# Patient Record
Sex: Female | Born: 1985 | Race: White | Hispanic: No | Marital: Single | Smoking: Former smoker
Health system: Southern US, Community
[De-identification: ages and names within clinical notes are randomized; demographics above are authoritative.]

## PROBLEM LIST (undated history)

## (undated) DIAGNOSIS — N83209 Unspecified ovarian cyst, unspecified side: Secondary | ICD-10-CM

## (undated) DIAGNOSIS — G4700 Insomnia, unspecified: Secondary | ICD-10-CM

## (undated) DIAGNOSIS — K5790 Diverticulosis of intestine, part unspecified, without perforation or abscess without bleeding: Secondary | ICD-10-CM

## (undated) DIAGNOSIS — Z789 Other specified health status: Secondary | ICD-10-CM

## (undated) DIAGNOSIS — F419 Anxiety disorder, unspecified: Secondary | ICD-10-CM

## (undated) HISTORY — DX: Unspecified ovarian cyst, unspecified side: N83.209

## (undated) HISTORY — DX: Other specified health status: Z78.9

## (undated) HISTORY — DX: Insomnia, unspecified: G47.00

## (undated) HISTORY — DX: Diverticulosis of intestine, part unspecified, without perforation or abscess without bleeding: K57.90

## (undated) HISTORY — DX: Anxiety disorder, unspecified: F41.9

---

## 2007-01-30 ENCOUNTER — Ambulatory Visit: Payer: Self-pay | Admitting: Family Medicine

## 2010-04-11 HISTORY — PX: INTRAUTERINE DEVICE (IUD) INSERTION: SHX5877

## 2011-12-13 ENCOUNTER — Ambulatory Visit: Payer: Self-pay | Admitting: Family Medicine

## 2014-03-11 ENCOUNTER — Ambulatory Visit: Payer: Self-pay

## 2015-03-09 ENCOUNTER — Encounter: Payer: Self-pay | Admitting: Family Medicine

## 2015-03-09 ENCOUNTER — Ambulatory Visit (INDEPENDENT_AMBULATORY_CARE_PROVIDER_SITE_OTHER): Payer: Self-pay | Admitting: Family Medicine

## 2015-03-09 VITALS — BP 120/60 | HR 72 | Ht 64.0 in | Wt 153.0 lb

## 2015-03-09 DIAGNOSIS — H109 Unspecified conjunctivitis: Secondary | ICD-10-CM

## 2015-03-09 DIAGNOSIS — J40 Bronchitis, not specified as acute or chronic: Secondary | ICD-10-CM

## 2015-03-09 MED ORDER — GUAIFENESIN-CODEINE 100-10 MG/5ML PO SOLN: 5.0000 mL | Freq: Three times a day (TID) | ORAL | Status: DC | PRN

## 2015-03-09 MED ORDER — SULFACETAMIDE SODIUM 10 % OP SOLN: 1.0000 [drp] | OPHTHALMIC | Status: DC

## 2015-03-09 MED ORDER — AZITHROMYCIN 250 MG PO TABS: ORAL_TABLET | ORAL | Status: DC

## 2015-03-09 NOTE — Progress Notes (Signed)
Name: Kelsey ClayJessica S Bryant   MRN: [id number]    DOB: Jul 25, 1985   Date:03/09/2015       Progress Note  Subjective  Chief Complaint  Chief Complaint  Patient presents with  . Conjunctivitis    started eye drop on Thursday- (Polymixin B) - eyes are still matted shut in am     Conjunctivitis  The current episode started 3 to 5 days ago. The problem occurs continuously. The problem has been gradually worsening. The problem is moderate. Nothing relieves the symptoms. Nothing aggravates the symptoms. Associated symptoms include eye itching, headaches, mouth sores, sore throat, cough, eye discharge and eye redness. Pertinent negatives include no fever, no decreased vision, no double vision, no photophobia, no abdominal pain, no constipation, no diarrhea, no nausea, no congestion, no ear discharge, no ear pain, no hearing loss, no rhinorrhea, no swollen glands, no neck pain, no wheezing, no rash and no eye pain. The eye pain is mild. Both eyes are affected.The eyelid exhibits redness.    No problem-specific assessment & plan notes found for this encounter.   No past medical history on file.  No past surgical history on file.  No family history on file.  Social History   Social History  . Marital Status: Single    Spouse Name: N/A  . Number of Children: N/A  . Years of Education: N/A   Occupational History  . Not on file.   Social History Main Topics  . Smoking status: Former Games developermoker  . Smokeless tobacco: Not on file  . Alcohol Use: 0.0 oz/week    0 Standard drinks or equivalent per week  . Drug Use: No  . Sexual Activity: Not on file   Other Topics Concern  . Not on file   Social History Narrative  . No narrative on file    Allergies  Allergen Reactions  . Ceftin [Cefuroxime]   . Penicillins      Review of Systems  Constitutional: Negative for fever, chills, weight loss and malaise/fatigue.  HENT: Positive for mouth sores and sore throat. Negative for congestion, ear  discharge, ear pain, hearing loss and rhinorrhea.   Eyes: Positive for discharge, redness and itching. Negative for blurred vision, double vision, photophobia and pain.  Respiratory: Positive for cough. Negative for sputum production, shortness of breath and wheezing.   Cardiovascular: Negative for chest pain, palpitations and leg swelling.  Gastrointestinal: Negative for heartburn, nausea, abdominal pain, diarrhea, constipation, blood in stool and melena.  Genitourinary: Negative for dysuria, urgency, frequency and hematuria.  Musculoskeletal: Negative for myalgias, back pain, joint pain and neck pain.  Skin: Negative for rash.  Neurological: Positive for headaches. Negative for dizziness, tingling, sensory change and focal weakness.  Endo/Heme/Allergies: Negative for environmental allergies and polydipsia. Does not bruise/bleed easily.  Psychiatric/Behavioral: Negative for depression and suicidal ideas. The patient is not nervous/anxious and does not have insomnia.      Objective  Filed Vitals:   03/09/15 1333  BP: 120/60  Pulse: 72  Height: 5\' 4"  (1.626 m)  Weight: 153 lb (69.4 kg)    Physical Exam  Constitutional: She is well-developed, well-nourished, and in no distress. No distress.  HENT:  Head: Normocephalic and atraumatic.  Right Ear: External ear normal.  Left Ear: External ear normal.  Nose: Nose normal.  Mouth/Throat: Oropharynx is clear and moist.  Eyes: Conjunctivae and EOM are normal. Pupils are equal, round, and reactive to light. Right eye exhibits no discharge. Left eye exhibits no discharge.  Neck: Normal  range of motion. Neck supple. No JVD present. No thyromegaly present.  Cardiovascular: Normal rate, regular rhythm, normal heart sounds and intact distal pulses.  Exam reveals no gallop and no friction rub.   No murmur heard. Pulmonary/Chest: Effort normal and breath sounds normal.  Abdominal: Soft. Bowel sounds are normal. She exhibits no mass. There is no  tenderness. There is no guarding.  Musculoskeletal: Normal range of motion. She exhibits no edema.  Lymphadenopathy:    She has no cervical adenopathy.  Neurological: She is alert. She has normal reflexes.  Skin: Skin is warm and dry. She is not diaphoretic.  Psychiatric: Mood and affect normal.  Nursing note and vitals reviewed.     Assessment & Plan  Problem List Items Addressed This Visit    None    Visit Diagnoses    Bilateral conjunctivitis    -  Primary    Relevant Medications    sulfacetamide (BLEPH-10) 10 % ophthalmic solution    Bronchitis        Relevant Medications    azithromycin (ZITHROMAX) 250 MG tablet    guaiFENesin-codeine 100-10 MG/5ML syrup         Dr. Hayden [facility]  03/09/2015

## 2015-03-09 NOTE — Patient Instructions (Signed)

## 2015-03-18 ENCOUNTER — Other Ambulatory Visit: Payer: Self-pay

## 2015-03-18 DIAGNOSIS — J019 Acute sinusitis, unspecified: Secondary | ICD-10-CM

## 2015-03-18 MED ORDER — DOXYCYCLINE HYCLATE 100 MG PO TABS: 100.0000 mg | ORAL_TABLET | Freq: Two times a day (BID) | ORAL | Status: DC

## 2015-11-26 ENCOUNTER — Ambulatory Visit (INDEPENDENT_AMBULATORY_CARE_PROVIDER_SITE_OTHER): Payer: Self-pay | Admitting: Family Medicine

## 2015-11-26 ENCOUNTER — Other Ambulatory Visit
Admission: RE | Admit: 2015-11-26 | Discharge: 2015-11-26 | Disposition: A | Discharge status: Home / Self Care | Payer: Self-pay | Source: Ambulatory Visit | Attending: Family Medicine | Admitting: Family Medicine

## 2015-11-26 ENCOUNTER — Encounter: Payer: Self-pay | Admitting: Family Medicine

## 2015-11-26 VITALS — BP 108/78 | HR 88 | Temp 98.2°F | Ht 64.0 in | Wt 168.0 lb

## 2015-11-26 DIAGNOSIS — R1030 Lower abdominal pain, unspecified: Secondary | ICD-10-CM | POA: Insufficient documentation

## 2015-11-26 DIAGNOSIS — K529 Noninfective gastroenteritis and colitis, unspecified: Secondary | ICD-10-CM

## 2015-11-26 LAB — CBC
HCT: 39 % (ref 35.0–47.0)
Hemoglobin: 13.4 g/dL (ref 12.0–16.0)
MCH: 31.6 pg (ref 26.0–34.0)
MCHC: 34.3 g/dL (ref 32.0–36.0)
MCV: 92.1 fL (ref 80.0–100.0)
PLATELETS: 208 10*3/uL (ref 150–440)
RBC: 4.23 MIL/uL (ref 3.80–5.20)
RDW: 12.5 % (ref 11.5–14.5)
WBC: 8.9 10*3/uL (ref 3.6–11.0)

## 2015-11-26 LAB — RENAL FUNCTION PANEL
ALBUMIN: 4.2 g/dL (ref 3.5–5.0)
Anion gap: 7 (ref 5–15)
BUN: 8 mg/dL (ref 6–20)
CHLORIDE: 106 mmol/L (ref 101–111)
CO2: 25 mmol/L (ref 22–32)
Calcium: 8.9 mg/dL (ref 8.9–10.3)
Creatinine, Ser: 0.66 mg/dL (ref 0.44–1.00)
GFR calc Af Amer: >60 mL/min (ref >60)
GFR calc non Af Amer: >60 mL/min (ref >60)
GLUCOSE: 92 mg/dL (ref 65–99)
POTASSIUM: 3.7 mmol/L (ref 3.5–5.1)
Phosphorus: 3.7 mg/dL (ref 2.5–4.6)
Sodium: 138 mmol/L (ref 135–145)

## 2015-11-26 LAB — LACTIC ACID, PLASMA: Lactic Acid, Venous: 0.7 mmol/L (ref 0.5–1.9)

## 2015-11-26 NOTE — Progress Notes (Signed)
Name: Kelsey ClayJessica S Bryant   MRN: [id number]    DOB: 1985/12/30   Date:11/26/2015       Progress Note  Subjective  Chief Complaint  Chief Complaint  Patient presents with  . Follow-up    ER visit for RLQ pain- CT showed no appendicitis/, did show enteritis. Abdominal pain is better today but still feels like "something is not right"- can't take oxycodone- makes her feel "dizzy/ horrible"    Abdominal Pain  This is a new problem. The current episode started in the past 7 days. The onset quality is sudden. The problem occurs constantly. The problem has been gradually improving. The pain is located in the RLQ. The pain is at a severity of 3/10. The pain is moderate. The quality of the pain is colicky. The abdominal pain radiates to the right flank and back. Associated symptoms include diarrhea, a fever and nausea. Pertinent negatives include no constipation, dysuria, frequency, headaches, hematochezia, hematuria, melena, myalgias or weight loss. The pain is aggravated by palpation. She has tried antibiotics and oral narcotic analgesics for the symptoms. The treatment provided moderate relief. Prior diagnostic workup includes CT scan. There is no history of Crohn's disease or ulcerative colitis.    No problem-specific Assessment & Plan notes found for this encounter.   History reviewed. No pertinent past medical history.  History reviewed. No pertinent surgical history.  History reviewed. No pertinent family history.  Social History   Social History  . Marital status: Single    Spouse name: N/A  . Number of children: N/A  . Years of education: N/A   Occupational History  . Not on file.   Social History Main Topics  . Smoking status: Former Games developermoker  . Smokeless tobacco: Never Used  . Alcohol use 0.0 oz/week  . Drug use: No  . Sexual activity: Yes   Other Topics Concern  . Not on file   Social History Narrative  . No narrative on file    Allergies  Allergen Reactions  .  Ceftin [Cefuroxime]   . Penicillins      Review of Systems  Constitutional: Positive for chills and fever. Negative for malaise/fatigue and weight loss.  HENT: Negative for ear discharge, ear pain and sore throat.   Eyes: Negative for blurred vision.  Respiratory: Negative for cough, sputum production, shortness of breath and wheezing.   Cardiovascular: Negative for chest pain, palpitations and leg swelling.  Gastrointestinal: Positive for abdominal pain, diarrhea and nausea. Negative for blood in stool, constipation, heartburn, hematochezia and melena.  Genitourinary: Negative for dysuria, frequency, hematuria and urgency.  Musculoskeletal: Negative for back pain, joint pain, myalgias and neck pain.  Skin: Negative for rash.  Neurological: Negative for dizziness, tingling, sensory change, focal weakness and headaches.  Endo/Heme/Allergies: Negative for environmental allergies and polydipsia. Does not bruise/bleed easily.  Psychiatric/Behavioral: Negative for depression and suicidal ideas. The patient is not nervous/anxious and does not have insomnia.      Objective  Vitals:   11/26/15 1339  BP: 108/78  Pulse: 88  Temp: 98.2 F (36.8 C)  TempSrc: Oral  Weight: 168 lb (76.2 kg)  Height: 5\' 4"  (1.626 m)    Physical Exam  Constitutional: She is well-developed, well-nourished, and in no distress. No distress.  HENT:  Head: Normocephalic and atraumatic.  Right Ear: External ear normal.  Left Ear: External ear normal.  Nose: Nose normal.  Mouth/Throat: Oropharynx is clear and moist.  Eyes: Conjunctivae and EOM are normal. Pupils are equal, round, and  reactive to light. Right eye exhibits no discharge. Left eye exhibits no discharge.  Neck: Normal range of motion. Neck supple. No JVD present. No thyromegaly present.  Cardiovascular: Normal rate, regular rhythm, normal heart sounds and intact distal pulses.  Exam reveals no gallop and no friction rub.   No murmur  heard. Pulmonary/Chest: Effort normal and breath sounds normal. She has no wheezes. She has no rales.  Abdominal: Soft. Bowel sounds are normal. She exhibits no mass. There is no hepatosplenomegaly, splenomegaly or hepatomegaly. There is tenderness in the right lower quadrant. There is no rigidity, no rebound, no guarding and no CVA tenderness.  Musculoskeletal: Normal range of motion. She exhibits no edema.  Lymphadenopathy:    She has no cervical adenopathy.  Neurological: She is alert. She has normal reflexes.  Skin: Skin is warm and dry. She is not diaphoretic.  Psychiatric: Mood and affect normal.  Nursing note and vitals reviewed.     Assessment & Plan  Problem List Items Addressed This Visit    None    Visit Diagnoses    Enteritis    -  Primary   contiue antibiotic/ recheck cbc/lactic acid        Dr. [facility]anna [facility]  11/26/15

## 2016-08-19 ENCOUNTER — Encounter: Payer: Self-pay | Admitting: Family Medicine

## 2016-08-19 ENCOUNTER — Ambulatory Visit (INDEPENDENT_AMBULATORY_CARE_PROVIDER_SITE_OTHER): Payer: Self-pay | Admitting: Family Medicine

## 2016-08-19 VITALS — BP 120/80 | HR 80 | Temp 98.7°F | Ht 64.0 in | Wt 177.0 lb

## 2016-08-19 DIAGNOSIS — L729 Follicular cyst of the skin and subcutaneous tissue, unspecified: Secondary | ICD-10-CM

## 2016-08-19 DIAGNOSIS — L089 Local infection of the skin and subcutaneous tissue, unspecified: Secondary | ICD-10-CM

## 2016-08-19 MED ORDER — DOXYCYCLINE HYCLATE 100 MG PO TABS: 100.0000 mg | ORAL_TABLET | Freq: Two times a day (BID) | ORAL | 0 refills | Status: DC

## 2016-08-19 NOTE — Progress Notes (Signed)
Name: Kelsey Bryant   MRN: [id number]    DOB: [birth year]   Date:08/19/2016       Progress Note  Subjective  Chief Complaint  Chief Complaint  Patient presents with  . Neck Pain    "have a place on my neck"- noticed on Tuesday- getting bigger. no oozing- but hurts very bad    Neck Pain   This is a new problem. The current episode started in the past 7 days. The problem occurs daily. The problem has been gradually worsening. Associated with: ?insect vs cyst. Pain location: left lateral neck. The pain is at a severity of 5/10. The pain is mild. Associated symptoms include headaches. Pertinent negatives include no chest pain, fever, leg pain, numbness, pain with swallowing, paresis, photophobia, syncope, tingling, trouble swallowing, visual change, weakness or weight loss. She has tried acetaminophen and ice (topical) for the symptoms.    No problem-specific Assessment & Plan notes found for this encounter.   No past medical history on file.  No past surgical history on file.  No family history on file.  Social History   Social History  . Marital status: Single    Spouse name: N/A  . Number of children: N/A  . Years of education: N/A   Occupational History  . Not on file.   Social History Main Topics  . Smoking status: Former Games developer  . Smokeless tobacco: Never Used  . Alcohol use 0.0 oz/week  . Drug use: No  . Sexual activity: Yes   Other Topics Concern  . Not on file   Social History Narrative  . No narrative on file    Allergies  Allergen Reactions  . Ceftin [Cefuroxime]   . Penicillins     Outpatient Medications Prior to Visit  Medication Sig Dispense Refill  . oxycodone (OXY-IR) 5 MG capsule Take 5 mg by mouth as needed.     No facility-administered medications prior to visit.     Review of Systems  Constitutional: Negative for chills, fever, malaise/fatigue and weight loss.  HENT: Negative for ear discharge, ear pain, sore throat and trouble  swallowing.   Eyes: Negative for blurred vision and photophobia.  Respiratory: Negative for cough, sputum production, shortness of breath and wheezing.   Cardiovascular: Negative for chest pain, palpitations, leg swelling and syncope.  Gastrointestinal: Negative for abdominal pain, blood in stool, constipation, diarrhea, heartburn, melena and nausea.  Genitourinary: Negative for dysuria, frequency, hematuria and urgency.  Musculoskeletal: Positive for neck pain. Negative for back pain, joint pain and myalgias.  Skin: Negative for rash.  Neurological: Positive for headaches. Negative for dizziness, tingling, sensory change, focal weakness, weakness and numbness.  Endo/Heme/Allergies: Negative for environmental allergies and polydipsia. Does not bruise/bleed easily.  Psychiatric/Behavioral: Negative for depression and suicidal ideas. The patient is not nervous/anxious and does not have insomnia.      Objective  Vitals:   08/19/16 1400  BP: 120/80  Pulse: 80  Temp: 98.7 F (37.1 C)  Weight: 177 lb (80.3 kg)  Height: 5\' 4"  (1.626 m)    Physical Exam  Constitutional: She is well-developed, well-nourished, and in no distress. No distress.  HENT:  Head: Normocephalic and atraumatic.  Right Ear: External ear normal.  Left Ear: External ear normal.  Nose: Nose normal.  Mouth/Throat: Oropharynx is clear and moist.  Eyes: Conjunctivae and EOM are normal. Pupils are equal, round, and reactive to light. Right eye exhibits no discharge. Left eye exhibits no discharge.  Neck: Normal range of motion.  Neck supple. No JVD present. No thyromegaly present.  Cardiovascular: Normal rate, regular rhythm, normal heart sounds and intact distal pulses.  Exam reveals no gallop and no friction rub.   No murmur heard. Pulmonary/Chest: Effort normal and breath sounds normal. She has no wheezes. She has no rales.  Abdominal: Soft. Bowel sounds are normal. She exhibits no mass. There is no tenderness. There  is no guarding.  Musculoskeletal: Normal range of motion. She exhibits no edema.  Lymphadenopathy:    She has no cervical adenopathy.  Neurological: She is alert. She has normal reflexes.  Skin: Skin is warm and dry. She is not diaphoretic. There is erythema.  tender  Psychiatric: Mood and affect normal.  Nursing note and vitals reviewed.     Assessment & Plan  Problem List Items Addressed This Visit    None    Visit Diagnoses    Infected cyst of skin    -  Primary   Relevant Medications   doxycycline (VIBRA-TABS) 100 MG tablet      Meds ordered this encounter  Medications  . doxycycline (VIBRA-TABS) 100 MG tablet    Sig: Take 1 tablet (100 mg total) by mouth 2 (two) times daily.    Dispense:  20 tablet    Refill:  0      Dr. Hayden Rasmusseneanna Graclyn Lawther [facility]  08/19/16

## 2017-09-26 ENCOUNTER — Encounter: Payer: Self-pay | Admitting: Family Medicine

## 2017-09-26 ENCOUNTER — Ambulatory Visit (INDEPENDENT_AMBULATORY_CARE_PROVIDER_SITE_OTHER): Payer: Self-pay | Admitting: Family Medicine

## 2017-09-26 VITALS — BP 110/80 | HR 100 | Ht 64.0 in | Wt 191.0 lb

## 2017-09-26 DIAGNOSIS — B009 Herpesviral infection, unspecified: Secondary | ICD-10-CM

## 2017-09-26 DIAGNOSIS — K122 Cellulitis and abscess of mouth: Secondary | ICD-10-CM

## 2017-09-26 MED ORDER — VALACYCLOVIR HCL 1 G PO TABS: 1000.0000 mg | ORAL_TABLET | Freq: Two times a day (BID) | ORAL | 0 refills | Status: DC

## 2017-09-26 MED ORDER — DOXYCYCLINE HYCLATE 100 MG PO TABS: 100.0000 mg | ORAL_TABLET | Freq: Two times a day (BID) | ORAL | 0 refills | Status: DC

## 2017-09-26 NOTE — Progress Notes (Signed)
Name: Kelsey Bryant   MRN: [id number]    DOB: November 22, 1985   Date:09/26/2017       Progress Note  Subjective  Chief Complaint  Chief Complaint  Patient presents with  . fever blister    on bottom lip from sun  . Neck Pain    lump under chin- tender. Noticed it this am    Onset of herpes 1 Sunday after sustain facial sunburn. Gradually worsened/increased swelling with tenderness. Patient developed swelling submental nodes.   No problem-specific Assessment & Plan notes found for this encounter.   History reviewed. No pertinent past medical history.  History reviewed. No pertinent surgical history.  History reviewed. No pertinent family history.  Social History   Socioeconomic History  . Marital status: Single    Spouse name: Not on file  . Number of children: Not on file  . Years of education: Not on file  . Highest education level: Not on file  Occupational History  . Not on file  Social Needs  . Financial resource strain: Not on file  . Food insecurity:    Worry: Not on file    Inability: Not on file  . Transportation needs:    Medical: Not on file    Non-medical: Not on file  Tobacco Use  . Smoking status: Former Games developer  . Smokeless tobacco: Never Used  Substance and Sexual Activity  . Alcohol use: Yes    Alcohol/week: 0.0 oz  . Drug use: No  . Sexual activity: Yes  Lifestyle  . Physical activity:    Days per week: Not on file    Minutes per session: Not on file  . Stress: Not on file  Relationships  . Social connections:    Talks on phone: Not on file    Gets together: Not on file    Attends religious service: Not on file    Active member of club or organization: Not on file    Attends meetings of clubs or organizations: Not on file    Relationship status: Not on file  . Intimate partner violence:    Fear of current or ex partner: Not on file    Emotionally abused: Not on file    Physically abused: Not on file    Forced sexual activity: Not on  file  Other Topics Concern  . Not on file  Social History Narrative  . Not on file    Allergies  Allergen Reactions  . Ceftin [Cefuroxime]   . Penicillins     Outpatient Medications Prior to Visit  Medication Sig Dispense Refill  . doxycycline (VIBRA-TABS) 100 MG tablet Take 1 tablet (100 mg total) by mouth 2 (two) times daily. 20 tablet 0   No facility-administered medications prior to visit.     Review of Systems  Constitutional: Negative for chills, fever, malaise/fatigue and weight loss.  HENT: Negative for ear discharge, ear pain and sore throat.   Eyes: Negative for blurred vision.  Respiratory: Negative for cough, sputum production, shortness of breath and wheezing.   Cardiovascular: Negative for chest pain, palpitations and leg swelling.  Gastrointestinal: Negative for abdominal pain, blood in stool, constipation, diarrhea, heartburn, melena and nausea.  Genitourinary: Negative for dysuria, frequency, hematuria and urgency.  Musculoskeletal: Negative for back pain, joint pain, myalgias and neck pain.  Skin: Negative for rash.  Neurological: Negative for dizziness, tingling, sensory change, focal weakness and headaches.  Endo/Heme/Allergies: Negative for environmental allergies and polydipsia. Does not bruise/bleed easily.  Psychiatric/Behavioral: Negative  for depression and suicidal ideas. The patient is not nervous/anxious and does not have insomnia.      Objective  Vitals:   09/26/17 1530  BP: 110/80  Pulse: 100  Weight: 191 lb (86.6 kg)  Height: 5\' 4"  (1.626 m)    Physical Exam  Constitutional: She is oriented to person, place, and time. She appears well-developed and well-nourished.  HENT:  Head: Normocephalic.  Right Ear: External ear normal.  Left Ear: External ear normal.  Mouth/Throat: Uvula is midline, oropharynx is clear and moist and mucous membranes are normal.  Swelling /tenderness right lower lip  Eyes: Pupils are equal, round, and reactive  to light. Conjunctivae and EOM are normal. Lids are everted and swept, no foreign bodies found. Left eye exhibits no hordeolum. No foreign body present in the left eye. Right conjunctiva is not injected. Left conjunctiva is not injected. No scleral icterus.  Neck: Normal range of motion. Neck supple. No JVD present. No tracheal deviation present. No thyromegaly present.  Cardiovascular: Normal rate, regular rhythm, normal heart sounds and intact distal pulses. Exam reveals no gallop and no friction rub.  No murmur heard. Pulmonary/Chest: Effort normal and breath sounds normal. No respiratory distress. She has no wheezes. She has no rales.  Abdominal: Soft. Bowel sounds are normal. She exhibits no mass. There is no hepatosplenomegaly. There is no tenderness. There is no rebound and no guarding.  Musculoskeletal: Normal range of motion. She exhibits no edema or tenderness.  Lymphadenopathy:       Head (right side): Submental adenopathy present.       Head (left side): No submental adenopathy present.    She has no cervical adenopathy.    She has no axillary adenopathy.  Neurological: She is alert and oriented to person, place, and time. She has normal strength. She displays normal reflexes. No cranial nerve deficit.  Skin: Skin is warm. No rash noted.  Psychiatric: She has a normal mood and affect. Her mood appears not anxious. She does not exhibit a depressed mood.  Nursing note and vitals reviewed.     Assessment & Plan  Problem List Items Addressed This Visit    None    Visit Diagnoses    Cellulitis of mouth    -  Primary   treat with doxy   Relevant Medications   doxycycline (VIBRA-TABS) 100 MG tablet   Herpes simplex       refill Valacyclovir   Relevant Medications   valACYclovir (VALTREX) 1000 MG tablet      Meds ordered this encounter  Medications  . doxycycline (VIBRA-TABS) 100 MG tablet    Sig: Take 1 tablet (100 mg total) by mouth 2 (two) times daily.    Dispense:  20  tablet    Refill:  0  . valACYclovir (VALTREX) 1000 MG tablet    Sig: Take 1 tablet (1,000 mg total) by mouth 2 (two) times daily.    Dispense:  8 tablet    Refill:  0      Dr. Hayden Rasmusseneanna Tedi Hughson [facility]  09/26/17

## 2017-10-04 ENCOUNTER — Telehealth: Payer: Self-pay

## 2017-10-04 DIAGNOSIS — B379 Candidiasis, unspecified: Secondary | ICD-10-CM

## 2017-10-04 DIAGNOSIS — T3695XA Adverse effect of unspecified systemic antibiotic, initial encounter: Principal | ICD-10-CM

## 2017-10-04 MED ORDER — FLUCONAZOLE 150 MG PO TABS
150.0000 mg | ORAL_TABLET | Freq: Once | ORAL | 0 refills | Status: AC
Start: 2017-10-04 — End: 2017-10-04

## 2017-10-04 NOTE — Telephone Encounter (Signed)
Pt wants diflucan sent in for yeast after antibiotic course

## 2017-10-05 ENCOUNTER — Ambulatory Visit: Payer: Self-pay | Admitting: Family Medicine

## 2018-06-06 ENCOUNTER — Encounter: Payer: Self-pay | Admitting: Obstetrics and Gynecology

## 2018-06-06 ENCOUNTER — Other Ambulatory Visit (HOSPITAL_COMMUNITY)
Admission: RE | Admit: 2018-06-06 | Discharge: 2018-06-06 | Disposition: A | Discharge status: Home / Self Care | Payer: Self-pay | Source: Ambulatory Visit | Attending: Obstetrics and Gynecology | Admitting: Obstetrics and Gynecology

## 2018-06-06 ENCOUNTER — Ambulatory Visit: Payer: Self-pay | Admitting: Obstetrics and Gynecology

## 2018-06-06 VITALS — BP 112/72 | HR 108 | Ht 64.0 in | Wt 199.0 lb

## 2018-06-06 DIAGNOSIS — Z124 Encounter for screening for malignant neoplasm of cervix: Secondary | ICD-10-CM | POA: Insufficient documentation

## 2018-06-06 DIAGNOSIS — Z01419 Encounter for gynecological examination (general) (routine) without abnormal findings: Secondary | ICD-10-CM

## 2018-06-06 DIAGNOSIS — Z30011 Encounter for initial prescription of contraceptive pills: Secondary | ICD-10-CM

## 2018-06-06 DIAGNOSIS — Z30432 Encounter for removal of intrauterine contraceptive device: Secondary | ICD-10-CM

## 2018-06-06 DIAGNOSIS — Z1339 Encounter for screening examination for other mental health and behavioral disorders: Secondary | ICD-10-CM

## 2018-06-06 DIAGNOSIS — Z1331 Encounter for screening for depression: Secondary | ICD-10-CM

## 2018-06-06 MED ORDER — NORGESTIMATE-ETH ESTRADIOL 0.25-35 MG-MCG PO TABS: 1.0000 | ORAL_TABLET | Freq: Every day | ORAL | 4 refills | Status: DC

## 2018-06-06 NOTE — Progress Notes (Signed)
Gynecology Annual Exam  PCP: Duanne Limerick, MD  Chief Complaint  Patient presents with  . Gynecologic Exam    IUD removal   History of Present Illness:  [name] is a 33 y.o. [id number] who LMP was Patient's last menstrual period was 05/23/2018 (exact date)., presents today for her annual examination.  Her menses are regular every 28-30 days, lasting 6 day(s).  Dysmenorrhea mild, occurring first 1-2 days of flow. She does not have intermenstrual bleeding.  She is sexually active and uses a Mirena IUD. She has no issues with intercourse.  Last Pap: a long time ago.  Results were: no abnormalities  Hx of STDs: none  There is a FH of breast cancer in her paternal grandmother. There is no FH of ovarian cancer. The patient does do self-breast exams.  Tobacco use: smokes 5 cigs/day for about 20 years. She is trying to quit.. Alcohol use: social drinker Exercise: not active  The patient wears seatbelts: yes.   The patient reports that domestic violence in her life is absent.   Patient is a 33 y.o. [id number] presenting for contraception consult.  She is currently on IUD and desiring to start OCP (estrogen/progesterone).  She has a past medical history significant for no contraindication to estrogen.  She specifically denies a history of migraine with aura, chronic hypertension and history of DVT/PE.  Reported Patient's last menstrual period was 05/23/2018 (exact date)..      Past Medical History:  Diagnosis Date  . No known health problems     Past Surgical History:  Procedure Laterality Date  . INTRAUTERINE DEVICE (IUD) INSERTION  2012    Prior to Admission medications   Medication Sig Start Date End Date Taking? Authorizing Provider  valACYclovir (VALTREX) 1000 MG tablet Take 1 tablet (1,000 mg total) by mouth 2 (two) times daily. 09/26/17  Yes Duanne Limerick, MD   Allergies  Allergen Reactions  . Ceftin [Cefuroxime]   . Penicillins    Obstetric History: [id number], s/p  SVD x 2  Social History   Socioeconomic History  . Marital status: Single    Spouse name: Not on file  . Number of children: Not on file  . Years of education: Not on file  . Highest education level: Not on file  Occupational History  . Not on file  Social Needs  . Financial resource strain: Not on file  . Food insecurity:    Worry: Not on file    Inability: Not on file  . Transportation needs:    Medical: Not on file    Non-medical: Not on file  Tobacco Use  . Smoking status: Former Games developer  . Smokeless tobacco: Never Used  Substance and Sexual Activity  . Alcohol use: Yes    Alcohol/week: 0.0 standard drinks  . Drug use: No  . Sexual activity: Yes    Birth control/protection: I.U.D.  Lifestyle  . Physical activity:    Days per week: Not on file    Minutes per session: Not on file  . Stress: Not on file  Relationships  . Social connections:    Talks on phone: Not on file    Gets together: Not on file    Attends religious service: Not on file    Active member of club or organization: Not on file    Attends meetings of clubs or organizations: Not on file    Relationship status: Not on file  . Intimate partner violence:    Fear  of current or ex partner: Not on file    Emotionally abused: Not on file    Physically abused: Not on file    Forced sexual activity: Not on file  Other Topics Concern  . Not on file  Social History Narrative  . Not on file    Family History  Problem Relation Age of Onset  . Pancreatic cancer Maternal Uncle 60  . Breast cancer Paternal Grandmother 43    Review of Systems  Constitutional: Negative.   HENT: Negative.   Eyes: Negative.   Respiratory: Negative.   Cardiovascular: Negative.   Gastrointestinal: Negative.   Genitourinary: Negative.   Musculoskeletal: Negative.   Skin: Negative.   Neurological: Negative.   Psychiatric/Behavioral: Negative.      Physical Exam BP 112/72 (BP Location: Left Arm, Patient Position:  Sitting, Cuff Size: Normal)   Pulse (!) 108   Ht 5\' 4"  (1.626 m)   Wt 199 lb (90.3 kg)   LMP 05/23/2018 (Exact Date)   BMI 34.16 kg/m    Physical Exam Constitutional:      General: She is not in acute distress.    Appearance: Normal appearance. She is well-developed.  Genitourinary:     Pelvic exam was performed with patient supine.     Vulva, urethra, bladder and uterus normal.     No inguinal adenopathy present in the right or left side.    No signs of injury in the vagina.     No vaginal discharge, erythema, tenderness or bleeding.     No cervical motion tenderness, discharge, lesion or polyp.     Uterus is mobile.     Uterus is not enlarged or tender.     No uterine mass detected.    Uterus is anteverted.     No right or left adnexal mass present.     Right adnexa not tender or full.     Left adnexa not tender or full.  HENT:     Head: Normocephalic and atraumatic.  Eyes:     General: No scleral icterus.    Conjunctiva/sclera: Conjunctivae normal.  Neck:     Musculoskeletal: Normal range of motion and neck supple.     Thyroid: No thyromegaly.  Cardiovascular:     Rate and Rhythm: Normal rate and regular rhythm.     Heart sounds: No murmur. No friction rub. No gallop.   Pulmonary:     Effort: Pulmonary effort is normal. No respiratory distress.     Breath sounds: Normal breath sounds. No wheezing or rales.  Chest:     Breasts:        Right: No inverted nipple, mass, nipple discharge, skin change or tenderness.        Left: No inverted nipple, mass, nipple discharge, skin change or tenderness.  Abdominal:     General: Bowel sounds are normal. There is no distension.     Palpations: Abdomen is soft. There is no mass.     Tenderness: There is no abdominal tenderness. There is no guarding or rebound.  Musculoskeletal: Normal range of motion.        General: No swelling or tenderness.  Lymphadenopathy:     Cervical: No cervical adenopathy.     Lower Body: No right  inguinal adenopathy. No left inguinal adenopathy.  Neurological:     General: No focal deficit present.     Mental Status: She is alert and oriented to person, place, and time.     Cranial Nerves: No cranial  nerve deficit.  Skin:    General: Skin is warm and dry.     Findings: No erythema or rash.  Psychiatric:        Mood and Affect: Mood normal.        Behavior: Behavior normal.        Judgment: Judgment normal.    IUD Removal  Patient identified, informed consent performed, consent signed.  Patient was in the dorsal lithotomy position, normal external genitalia was noted.  A speculum was placed in the patient's vagina, normal discharge was noted, no lesions. The cervix was visualized, no lesions, no abnormal discharge.  The strings of the IUD were grasped and pulled using ring forceps. The IUD was removed in its entirety. Patient tolerated the procedure well.    Patient will use combined OCPs for contraception.  Routine preventative health maintenance measures emphasized.  Female chaperone present for pelvic and breast  portions of the physical exam  Results: AUDIT Questionnaire (screen for alcoholism): 3 PHQ-9: 2  Assessment: 33 y.o. G6P2002 female here for routine annual gynecologic examination  Plan: Problem List Items Addressed This Visit    None    Visit Diagnoses    Women's annual routine gynecological examination    -  Primary   Relevant Medications   norgestimate-ethinyl estradiol (ORTHO-CYCLEN,SPRINTEC,PREVIFEM) 0.25-35 MG-MCG tablet   Other Relevant Orders   Cytology - PAP   Screening for depression       Screening for alcoholism       Pap smear for cervical cancer screening       Relevant Orders   Cytology - PAP   Encounter for initial prescription of contraceptive pills       Relevant Medications   norgestimate-ethinyl estradiol (ORTHO-CYCLEN,SPRINTEC,PREVIFEM) 0.25-35 MG-MCG tablet   Encounter for IUD removal          Screening: -- Blood pressure  screen normal -- Weight screening: obese: discussed management options, including lifestyle, dietary, and exercise. -- Depression screening negative (PHQ-9) -- Nutrition: normal -- cholesterol screening: not due for screening -- osteoporosis screening: not due -- tobacco screening: not using -- alcohol screening: AUDIT questionnaire indicates low-risk usage. -- family history of breast cancer screening: done. not at high risk. -- no evidence of domestic violence or intimate partner violence. -- STD screening: gonorrhea/chlamydia NAAT not collected per patient request. -- pap smear collected per ASCCP guidelines -- flu vaccine declines -- HPV vaccination series: not eligilbe and did not receive.   Reviewed all forms of birth control options available including abstinence; over the counter/barrier methods; hormonal contraceptive medication including pill, patch, ring, injection,contraceptive implant; hormonal and nonhormonal IUDs; permanent sterilization options including vasectomy and the various tubal sterilization modalities. Risks and benefits reviewed.  Questions were answered.  She would like combined OCPs. Discussed increased VTE risk and she voiced understanding. Rx provided. IUD removed today and she will use pills starting today. She was instructed to use a form of backup for 2 weeks.   Family History of pancreatic cancer: discussed genetic screening .She does not have insurance. Discussed testing at [facility] and offered referral. She declines at this time.    Thomasene Mohair, MD 06/06/2018 10:23 AM

## 2018-06-08 LAB — CYTOLOGY - PAP
Diagnosis: NEGATIVE
HPV: NOT DETECTED

## 2018-06-26 ENCOUNTER — Encounter: Payer: Self-pay | Admitting: Family Medicine

## 2018-07-16 ENCOUNTER — Other Ambulatory Visit: Payer: Self-pay | Admitting: Family Medicine

## 2018-07-16 DIAGNOSIS — B009 Herpesviral infection, unspecified: Secondary | ICD-10-CM

## 2018-10-02 ENCOUNTER — Other Ambulatory Visit: Payer: Self-pay

## 2018-10-02 DIAGNOSIS — B009 Herpesviral infection, unspecified: Secondary | ICD-10-CM

## 2018-10-02 MED ORDER — VALACYCLOVIR HCL 1 G PO TABS: 1000.0000 mg | ORAL_TABLET | Freq: Two times a day (BID) | ORAL | 0 refills | Status: DC

## 2018-10-02 NOTE — Progress Notes (Unsigned)
Sent in valtrex

## 2019-07-01 ENCOUNTER — Other Ambulatory Visit: Payer: Self-pay | Admitting: Obstetrics and Gynecology

## 2019-07-01 DIAGNOSIS — Z01419 Encounter for gynecological examination (general) (routine) without abnormal findings: Secondary | ICD-10-CM

## 2019-07-01 DIAGNOSIS — Z30011 Encounter for initial prescription of contraceptive pills: Secondary | ICD-10-CM

## 2019-08-19 ENCOUNTER — Ambulatory Visit (INDEPENDENT_AMBULATORY_CARE_PROVIDER_SITE_OTHER): Payer: Self-pay | Admitting: Family Medicine

## 2019-08-19 ENCOUNTER — Encounter: Payer: Self-pay | Admitting: Family Medicine

## 2019-08-19 VITALS — BP 120/80 | HR 80 | Ht 64.0 in | Wt 193.0 lb

## 2019-08-19 DIAGNOSIS — K219 Gastro-esophageal reflux disease without esophagitis: Secondary | ICD-10-CM

## 2019-08-19 DIAGNOSIS — R079 Chest pain, unspecified: Secondary | ICD-10-CM

## 2019-08-19 MED ORDER — PANTOPRAZOLE SODIUM 40 MG PO TBEC: 40.0000 mg | DELAYED_RELEASE_TABLET | Freq: Every day | ORAL | 3 refills | Status: DC

## 2019-08-19 NOTE — Progress Notes (Signed)
Date:  08/19/2019   Name:  Kelsey Bryant   DOB:  01-25-1986   MRN:  [id number]   Chief Complaint: Heartburn (started last Wed and she passed blood in stool on Saturday. Blood in underwear and in water)  Heartburn She complains of belching, chest pain, heartburn and nausea. She reports no abdominal pain, no choking, no coughing, no dysphagia, no early satiety, no hoarse voice, no sore throat, no stridor, no tooth decay, no water brash or no wheezing. This is a new problem. The current episode started in the past 7 days. The problem occurs constantly. The problem has been waxing and waning. Heartburn duration: almost constant. The heartburn is located in the substernum. The heartburn is of moderate (5/10 now) intensity. The symptoms are aggravated by certain foods. Pertinent negatives include no fatigue or melena. Risk factors include caffeine use, NSAIDs and obesity. She has tried an antacid and a PPI for the symptoms. The treatment provided moderate relief. Past procedures do not include a UGI.    Lab Results  Component Value Date   CREATININE 0.66 11/26/2015   BUN 8 11/26/2015   NA 138 11/26/2015   K 3.7 11/26/2015   CL 106 11/26/2015   CO2 25 11/26/2015   No results found for: CHOL, HDL, LDLCALC, LDLDIRECT, TRIG, CHOLHDL No results found for: TSH No results found for: HGBA1C Lab Results  Component Value Date   WBC 8.9 11/26/2015   HGB 13.4 11/26/2015   HCT 39.0 11/26/2015   MCV 92.1 11/26/2015   PLT 208 11/26/2015   No results found for: ALT, AST, GGT, ALKPHOS, BILITOT   Review of Systems  Constitutional: Negative.  Negative for chills, fatigue, fever and unexpected weight change.  HENT: Negative for congestion, ear discharge, ear pain, hoarse voice, rhinorrhea, sinus pressure, sneezing and sore throat.   Eyes: Negative for photophobia, pain, discharge, redness and itching.  Respiratory: Negative for cough, choking, shortness of breath, wheezing and stridor.     Cardiovascular: Positive for chest pain. Negative for palpitations and leg swelling.  Gastrointestinal: Positive for blood in stool, heartburn and nausea. Negative for abdominal pain, anal bleeding, constipation, diarrhea, dysphagia, melena and vomiting.  Endocrine: Negative for cold intolerance, heat intolerance, polydipsia, polyphagia and polyuria.  Genitourinary: Negative for dysuria, flank pain, frequency, hematuria, menstrual problem, pelvic pain, urgency, vaginal bleeding and vaginal discharge.  Musculoskeletal: Negative for arthralgias, back pain and myalgias.  Skin: Negative for rash.  Allergic/Immunologic: Negative for environmental allergies and food allergies.  Neurological: Negative for dizziness, weakness, light-headedness, numbness and headaches.  Hematological: Negative for adenopathy. Does not bruise/bleed easily.  Psychiatric/Behavioral: Negative for dysphoric mood. The patient is not nervous/anxious.     There are no problems to display for this patient.   Allergies  Allergen Reactions  . Ceftin [Cefuroxime]   . Penicillins     Past Surgical History:  Procedure Laterality Date  . INTRAUTERINE DEVICE (IUD) INSERTION  2012    Social History   Tobacco Use  . Smoking status: Former Games developer  . Smokeless tobacco: Never Used  Substance Use Topics  . Alcohol use: Yes    Alcohol/week: 0.0 standard drinks  . Drug use: No     Medication list has been reviewed and updated.  Current Meds  Medication Sig  . SPRINTEC 28 0.25-35 MG-MCG tablet TAKE 1 TABLET BY MOUTH EVERY DAY  . valACYclovir (VALTREX) 1000 MG tablet Take 1 tablet (1,000 mg total) by mouth 2 (two) times daily.    PHQ  2/9 Scores 08/19/2019 06/06/2018 09/26/2017  PHQ - 2 Score 0 0 0  PHQ- 9 Score 0 2 0    BP Readings from Last 3 Encounters:  08/19/19 120/80  06/06/18 112/72  09/26/17 110/80    Physical Exam  Wt Readings from Last 3 Encounters:  08/19/19 193 lb (87.5 kg)  06/06/18 199 lb (90.3  kg)  09/26/17 191 lb (86.6 kg)    BP 120/80   Pulse 80   Ht 5\' 4"  (1.626 m)   Wt 193 lb (87.5 kg)   LMP 07/27/2019 (Approximate)   BMI 33.13 kg/m   Assessment and Plan: 1. Chest pain due to GERD New onset.  Persistent.  Uncontrolled.  Patient describes this as heartburn but this has occurred after she has been taking NSAIDs for her sciatica.  1 episode of blood per rectum that was red without melena.  Patient's been taking over-the-counter Tums with oMeprazole.  EKG was done and results as follows: I have reviewed EKG which shows sinus rhythm.  Rate 94.  Decreased voltage but patient has large breasts.  No voltage evidence for LVH.  Intervals normal.  No ischemic changes such as Q waves, delayed R wave, nor ST-T wave changes.. Comparison to previous EKG dated none.  We will initiate antacids on an hourly basis while awake of a dose cup.  We will initiate pantoprazole 40 mg and she will take it twice a day until she sees GI on Thursday.  Patient has strict instructions that the pain should continue or worsen she is to go to the emergency room for further evaluation.  Patient is reluctant to go to the ER after long discussion and understands the concerns that we have if this does not resolve.  Patient has a referral to gastroenterology extranodal clinic for this coming Thursday at 915 to arrive 30 minutes early.  I have concerns because of her NSAID use and the episode of bleeding that there may have been a peptic ulcer that may have bled which is now quiesced sent with guaiac being negative at this time - EKG 12-Lead - pantoprazole (PROTONIX) 40 MG tablet; Take 1 tablet (40 mg total) by mouth daily.  Dispense: 30 tablet; Refill: 3 - Ambulatory referral to Gastroenterology

## 2019-09-06 ENCOUNTER — Ambulatory Visit (INDEPENDENT_AMBULATORY_CARE_PROVIDER_SITE_OTHER): Payer: Self-pay | Admitting: Family Medicine

## 2019-09-06 ENCOUNTER — Encounter: Payer: Self-pay | Admitting: Family Medicine

## 2019-09-06 ENCOUNTER — Other Ambulatory Visit: Payer: Self-pay

## 2019-09-06 VITALS — BP 100/62 | HR 80 | Ht 64.0 in | Wt 192.0 lb

## 2019-09-06 DIAGNOSIS — F410 Panic disorder [episodic paroxysmal anxiety] without agoraphobia: Secondary | ICD-10-CM

## 2019-09-06 DIAGNOSIS — F419 Anxiety disorder, unspecified: Secondary | ICD-10-CM

## 2019-09-06 DIAGNOSIS — F329 Major depressive disorder, single episode, unspecified: Secondary | ICD-10-CM

## 2019-09-06 MED ORDER — SERTRALINE HCL 50 MG PO TABS: 50.0000 mg | ORAL_TABLET | Freq: Every day | ORAL | 3 refills | Status: DC

## 2019-09-06 MED ORDER — ALPRAZOLAM 0.25 MG PO TABS: 0.2500 mg | ORAL_TABLET | Freq: Two times a day (BID) | ORAL | 0 refills | Status: DC | PRN

## 2019-09-06 NOTE — Progress Notes (Signed)
Date:  09/06/2019   Name:  Kelsey Bryant   DOB:  [birth year]   MRN:  [id number]   Chief Complaint: Anxiety (PHQ9=9 and GAD7=14)  Anxiety Presents for follow-up visit. Symptoms include chest pain, excessive worry, feeling of choking, nervous/anxious behavior, panic and restlessness. Patient reports no decreased concentration, dizziness, nausea or shortness of breath. Symptoms occur occasionally. The severity of symptoms is mild. The quality of sleep is good.    Depression        This is a new problem.  The current episode started more than 1 month ago.   The onset quality is gradual.   The problem occurs intermittently.  The problem has been waxing and waning since onset.  Associated symptoms include irritable, restlessness and sad.  Associated symptoms include no decreased concentration, no fatigue, no myalgias, no headaches and no indigestion.     The symptoms are aggravated by work stress and family issues (empty nest).  Past treatments include SSRIs - Selective serotonin reuptake inhibitors.  Compliance with treatment is good.  Previous treatment provided mild relief.  Past medical history includes anxiety.     Lab Results  Component Value Date   CREATININE 0.66 11/26/2015   BUN 8 11/26/2015   NA 138 11/26/2015   K 3.7 11/26/2015   CL 106 11/26/2015   CO2 25 11/26/2015   No results found for: CHOL, HDL, LDLCALC, LDLDIRECT, TRIG, CHOLHDL No results found for: TSH No results found for: HGBA1C Lab Results  Component Value Date   WBC 8.9 11/26/2015   HGB 13.4 11/26/2015   HCT 39.0 11/26/2015   MCV 92.1 11/26/2015   PLT 208 11/26/2015   No results found for: ALT, AST, GGT, ALKPHOS, BILITOT   Review of Systems  Constitutional: Negative.  Negative for chills, fatigue, fever and unexpected weight change.  HENT: Negative for congestion, ear discharge, ear pain, rhinorrhea, sinus pressure, sneezing and sore throat.   Eyes: Negative for photophobia, pain, discharge, redness and  itching.  Respiratory: Negative for cough, shortness of breath, wheezing and stridor.   Cardiovascular: Positive for chest pain.  Gastrointestinal: Negative for abdominal pain, blood in stool, constipation, diarrhea, nausea and vomiting.  Endocrine: Negative for cold intolerance, heat intolerance, polydipsia, polyphagia and polyuria.  Genitourinary: Negative for dysuria, flank pain, frequency, hematuria, menstrual problem, pelvic pain, urgency, vaginal bleeding and vaginal discharge.  Musculoskeletal: Negative for arthralgias, back pain and myalgias.  Skin: Negative for rash.  Allergic/Immunologic: Negative for environmental allergies and food allergies.  Neurological: Negative for dizziness, weakness, light-headedness, numbness and headaches.  Hematological: Negative for adenopathy. Does not bruise/bleed easily.  Psychiatric/Behavioral: Positive for depression. Negative for decreased concentration and dysphoric mood. The patient is nervous/anxious.     There are no problems to display for this patient.   Allergies  Allergen Reactions  . Ceftin [Cefuroxime]   . Penicillins     Past Surgical History:  Procedure Laterality Date  . INTRAUTERINE DEVICE (IUD) INSERTION  2012    Social History   Tobacco Use  . Smoking status: Former Research scientist (life sciences)  . Smokeless tobacco: Never Used  Substance Use Topics  . Alcohol use: Yes    Alcohol/week: 0.0 standard drinks  . Drug use: No     Medication list has been reviewed and updated.  No outpatient medications have been marked as taking for the 09/06/19 encounter (Office Visit) with Juline Patch, MD.    [facility] 2/9 Scores 09/06/2019 08/19/2019 06/06/2018 09/26/2017  PHQ - 2 Score 3 0  0 0  PHQ- 9 Score 9 0 2 0    BP Readings from Last 3 Encounters:  09/06/19 100/62  08/19/19 120/80  06/06/18 112/72    Physical Exam Vitals and nursing note reviewed.  Constitutional:      General: She is irritable.     Appearance: She is well-developed.    HENT:     Head: Normocephalic.     Right Ear: Tympanic membrane, ear canal and external ear normal.     Left Ear: Tympanic membrane, ear canal and external ear normal.     Nose: Nose normal.  Eyes:     General: Lids are everted, no foreign bodies appreciated. No scleral icterus.       Left eye: No foreign body or hordeolum.     Conjunctiva/sclera: Conjunctivae normal.     Right eye: Right conjunctiva is not injected.     Left eye: Left conjunctiva is not injected.     Pupils: Pupils are equal, round, and reactive to light.  Neck:     Thyroid: No thyromegaly.     Vascular: No JVD.     Trachea: No tracheal deviation.  Cardiovascular:     Rate and Rhythm: Normal rate and regular rhythm.     Heart sounds: Normal heart sounds. No murmur. No friction rub. No gallop.   Pulmonary:     Effort: Pulmonary effort is normal. No respiratory distress.     Breath sounds: Normal breath sounds. No wheezing, rhonchi or rales.  Abdominal:     General: Bowel sounds are normal.     Palpations: Abdomen is soft. There is no mass.     Tenderness: There is no abdominal tenderness. There is no guarding or rebound.  Musculoskeletal:        General: No tenderness. Normal range of motion.     Cervical back: Normal range of motion and neck supple.  Lymphadenopathy:     Cervical: No cervical adenopathy.  Skin:    General: Skin is warm.     Findings: No rash.  Neurological:     Mental Status: She is alert and oriented to person, place, and time.     Cranial Nerves: No cranial nerve deficit.     Deep Tendon Reflexes: Reflexes normal.  Psychiatric:        Mood and Affect: Mood is not anxious or depressed.     Wt Readings from Last 3 Encounters:  09/06/19 192 lb (87.1 kg)  08/19/19 193 lb (87.5 kg)  06/06/18 199 lb (90.3 kg)    BP 100/62   Pulse 80   Ht 5\' 4"  (1.626 m)   Wt 192 lb (87.1 kg)   LMP 09/02/2019   BMI 32.96 kg/m   Assessment and Plan:  1. Anxiety Chronic.  Uncontrolled.   Relatively stable but needs some assistance from a medication standpoint.  Gad score is 14.  We will initiate sertraline at 25 mg once a day for a 10-day.  To then increase to 50 mg daily I will recheck patient in 6 weeks - sertraline (ZOLOFT) 50 MG tablet; Take 1 tablet (50 mg total) by mouth daily. Initiate half tabley q aday for 10 days then 1 a day  Dispense: 30 tablet; Refill: 3  2. Reactive depression Chronic.  Relatively controlled.  Stable.  PHQ score 9.  Patient is on no medication and with the above use of sertraline I am anticipating some improvement. - sertraline (ZOLOFT) 50 MG tablet; Take 1 tablet (50 mg total)  by mouth daily. Initiate half tabley q aday for 10 days then 1 a day  Dispense: 30 tablet; Refill: 3  3. Panic attacks Patient is having some panic attacks that are episodic in nature but can be rather incapacitating.  We will give her some alprazolam 0.25 1/2-1 as needed for these not to exceed 2 times a day.  Patient understands we will not be using this long-term but only until we have sufficient levels of sertraline.  We will recheck in 6 weeks - ALPRAZolam (XANAX) 0.25 MG tablet; Take 1 tablet (0.25 mg total) by mouth 2 (two) times daily as needed for anxiety.  Dispense: 30 tablet; Refill: 0

## 2019-09-20 ENCOUNTER — Ambulatory Visit: Payer: Self-pay | Admitting: Family Medicine

## 2019-09-28 ENCOUNTER — Other Ambulatory Visit: Payer: Self-pay | Admitting: Family Medicine

## 2019-09-28 DIAGNOSIS — F419 Anxiety disorder, unspecified: Secondary | ICD-10-CM

## 2019-09-28 DIAGNOSIS — F329 Major depressive disorder, single episode, unspecified: Secondary | ICD-10-CM

## 2019-12-23 ENCOUNTER — Other Ambulatory Visit: Payer: Self-pay | Admitting: Family Medicine

## 2019-12-23 DIAGNOSIS — K219 Gastro-esophageal reflux disease without esophagitis: Secondary | ICD-10-CM

## 2019-12-23 DIAGNOSIS — R079 Chest pain, unspecified: Secondary | ICD-10-CM

## 2020-01-09 ENCOUNTER — Other Ambulatory Visit: Payer: Self-pay | Admitting: Family Medicine

## 2020-01-09 DIAGNOSIS — F329 Major depressive disorder, single episode, unspecified: Secondary | ICD-10-CM

## 2020-01-09 DIAGNOSIS — F419 Anxiety disorder, unspecified: Secondary | ICD-10-CM

## 2020-01-09 NOTE — Telephone Encounter (Signed)
Requested Prescriptions  Pending Prescriptions Disp Refills  . sertraline (ZOLOFT) 50 MG tablet [Pharmacy Med Name: SERTRALINE HCL 50 MG TABLET] 30 tablet 3    Sig: TAKE 1/2 TABLET EVERY DAY FOR 10 DAYS THEN TAKE 1 TABLET EVERY DAY     Psychiatry:  Antidepressants - SSRI Passed - 01/09/2020  1:33 AM      Passed - Valid encounter within last 6 months    Recent Outpatient Visits          4 months ago Anxiety   [facility] Duanne Limerick, MD   4 months ago Chest pain due to GERD   [facility] Duanne Limerick, MD   2 years ago Cellulitis of mouth   [facility] Duanne Limerick, MD   3 years ago Infected cyst of skin   [facility] Duanne Limerick, MD   4 years ago Enteritis   [facility] Duanne Limerick, MD

## 2020-03-26 ENCOUNTER — Telehealth: Payer: Self-pay

## 2020-03-26 NOTE — Telephone Encounter (Unsigned)
Copied from CRM [id number]. Topic: General - Inquiry >> Mar 26, 2020  2:29 PM Adrian Prince D wrote: Reason for CRM: Patient would like a call back from Erskine Squibb, she has a question about her medication. She can be reached at [phone]. Please advise

## 2020-03-31 ENCOUNTER — Ambulatory Visit: Payer: Self-pay | Admitting: Family Medicine

## 2020-04-24 ENCOUNTER — Telehealth: Payer: Self-pay

## 2020-04-24 ENCOUNTER — Other Ambulatory Visit: Payer: Self-pay

## 2020-04-24 ENCOUNTER — Ambulatory Visit: Payer: Self-pay | Admitting: Family Medicine

## 2020-04-24 ENCOUNTER — Encounter: Payer: Self-pay | Admitting: Family Medicine

## 2020-04-24 ENCOUNTER — Ambulatory Visit (INDEPENDENT_AMBULATORY_CARE_PROVIDER_SITE_OTHER): Payer: Self-pay | Admitting: Family Medicine

## 2020-04-24 VITALS — BP 120/80 | HR 64 | Temp 98.4°F | Ht 64.0 in | Wt 193.0 lb

## 2020-04-24 DIAGNOSIS — F410 Panic disorder [episodic paroxysmal anxiety] without agoraphobia: Secondary | ICD-10-CM

## 2020-04-24 DIAGNOSIS — J0100 Acute maxillary sinusitis, unspecified: Secondary | ICD-10-CM

## 2020-04-24 DIAGNOSIS — F419 Anxiety disorder, unspecified: Secondary | ICD-10-CM

## 2020-04-24 DIAGNOSIS — F329 Major depressive disorder, single episode, unspecified: Secondary | ICD-10-CM

## 2020-04-24 MED ORDER — SERTRALINE HCL 50 MG PO TABS: 75.0000 mg | ORAL_TABLET | Freq: Every day | ORAL | 3 refills | Status: DC

## 2020-04-24 MED ORDER — ALPRAZOLAM 0.25 MG PO TABS: 0.2500 mg | ORAL_TABLET | Freq: Two times a day (BID) | ORAL | 0 refills | Status: DC | PRN

## 2020-04-24 MED ORDER — AZITHROMYCIN 250 MG PO TABS: ORAL_TABLET | ORAL | 0 refills | Status: DC

## 2020-04-24 NOTE — Telephone Encounter (Signed)
Sched this afternoon 120

## 2020-04-24 NOTE — Progress Notes (Signed)
Date:  04/24/2020   Name:  Kelsey Bryant   DOB:  1985-11-27   MRN:  [id number]   Chief Complaint: Sinusitis (Cough, facial pain/pressure) and Depression (1 and 11- increase zoloft)  Sinusitis This is a new problem. The current episode started in the past 7 days (few days ago). The problem has been gradually worsening since onset. The pain is mild. Pertinent negatives include no chills, congestion, coughing, diaphoresis, ear pain, headaches, hoarse voice, neck pain, shortness of breath, sinus pressure, sneezing, sore throat or swollen glands. The treatment provided mild relief.  Depression        This is a new problem.  The current episode started more than 1 year ago.   The onset quality is gradual.   Associated symptoms include helplessness, irritable, restlessness and decreased interest.  Associated symptoms include no fatigue, no myalgias, no headaches, not sad and no suicidal ideas.  Past treatments include SSRIs - Selective serotonin reuptake inhibitors.  Compliance with treatment is variable.  Previous treatment provided mild relief.   Lab Results  Component Value Date   CREATININE 0.66 11/26/2015   BUN 8 11/26/2015   NA 138 11/26/2015   K 3.7 11/26/2015   CL 106 11/26/2015   CO2 25 11/26/2015   No results found for: CHOL, HDL, LDLCALC, LDLDIRECT, TRIG, CHOLHDL No results found for: TSH No results found for: HGBA1C Lab Results  Component Value Date   WBC 8.9 11/26/2015   HGB 13.4 11/26/2015   HCT 39.0 11/26/2015   MCV 92.1 11/26/2015   PLT 208 11/26/2015   No results found for: ALT, AST, GGT, ALKPHOS, BILITOT   Review of Systems  Constitutional: Negative.  Negative for chills, diaphoresis, fatigue, fever and unexpected weight change.  HENT: Negative for congestion, ear discharge, ear pain, hoarse voice, rhinorrhea, sinus pressure, sneezing and sore throat.   Eyes: Negative for double vision, photophobia, pain, discharge, redness and itching.  Respiratory: Negative  for cough, shortness of breath, wheezing and stridor.   Cardiovascular: Negative for leg swelling.  Gastrointestinal: Negative for abdominal pain, blood in stool, constipation, diarrhea, nausea and vomiting.  Endocrine: Negative for cold intolerance, heat intolerance, polydipsia, polyphagia and polyuria.  Genitourinary: Negative for dysuria, flank pain, frequency, hematuria, menstrual problem, pelvic pain, urgency, vaginal bleeding and vaginal discharge.  Musculoskeletal: Negative for arthralgias, back pain, myalgias and neck pain.  Skin: Negative for rash.  Allergic/Immunologic: Negative for environmental allergies and food allergies.  Neurological: Negative for dizziness, weakness, light-headedness, numbness and headaches.  Hematological: Negative for adenopathy. Does not bruise/bleed easily.  Psychiatric/Behavioral: Positive for depression. Negative for dysphoric mood and suicidal ideas. The patient is not nervous/anxious.     There are no problems to display for this patient.   Allergies  Allergen Reactions  . Ceftin [Cefuroxime]   . Penicillins     Past Surgical History:  Procedure Laterality Date  . INTRAUTERINE DEVICE (IUD) INSERTION  2012    Social History   Tobacco Use  . Smoking status: Former Games developer  . Smokeless tobacco: Never Used  Vaping Use  . Vaping Use: Never used  Substance Use Topics  . Alcohol use: Yes    Alcohol/week: 0.0 standard drinks  . Drug use: No     Medication list has been reviewed and updated.  Current Meds  Medication Sig  . pantoprazole (PROTONIX) 40 MG tablet TAKE 1 TABLET BY MOUTH EVERY DAY  . sertraline (ZOLOFT) 50 MG tablet TAKE 1/2 TABLET EVERY DAY FOR 10 DAYS THEN  TAKE 1 TABLET EVERY DAY (Patient taking differently: Take 50 mg by mouth daily.)  . SPRINTEC 28 0.25-35 MG-MCG tablet TAKE 1 TABLET BY MOUTH EVERY DAY  . valACYclovir (VALTREX) 1000 MG tablet Take 1 tablet (1,000 mg total) by mouth 2 (two) times daily.    PHQ 2/9  Scores 04/24/2020 09/06/2019 08/19/2019 06/06/2018  PHQ - 2 Score 0 3 0 0  PHQ- 9 Score 1 9 0 2    GAD 7 : Generalized Anxiety Score 04/24/2020 09/06/2019 08/19/2019  Nervous, Anxious, on Edge 0 3 0  Control/stop worrying 3 3 0  Worry too much - different things 2 3 0  Trouble relaxing 1 2 0  Restless 2 0 0  Easily annoyed or irritable 3 3 0  Afraid - awful might happen 0 0 0  Total GAD 7 Score 11 14 0  Anxiety Difficulty Very difficult Somewhat difficult -    BP Readings from Last 3 Encounters:  04/24/20 120/80  09/06/19 100/62  08/19/19 120/80    Physical Exam Vitals and nursing note reviewed.  Constitutional:      General: She is irritable.     Appearance: She is well-developed and well-nourished.  HENT:     Head: Normocephalic.     Right Ear: Tympanic membrane, ear canal and external ear normal. There is no impacted cerumen.     Left Ear: Tympanic membrane, ear canal and external ear normal. There is no impacted cerumen.     Nose: Nose normal.     Mouth/Throat:     Mouth: Oropharynx is clear and moist.  Eyes:     General: Lids are everted, no foreign bodies appreciated. No scleral icterus.       Left eye: No foreign body or hordeolum.     Extraocular Movements: EOM normal.     Conjunctiva/sclera: Conjunctivae normal.     Right eye: Right conjunctiva is not injected.     Left eye: Left conjunctiva is not injected.     Pupils: Pupils are equal, round, and reactive to light.  Neck:     Thyroid: No thyromegaly.     Vascular: No JVD.     Trachea: No tracheal deviation.  Cardiovascular:     Rate and Rhythm: Normal rate and regular rhythm.     Pulses: Intact distal pulses.     Heart sounds: Normal heart sounds. No murmur heard. No friction rub. No gallop.   Pulmonary:     Effort: Pulmonary effort is normal. No respiratory distress.     Breath sounds: Normal breath sounds. No wheezing, rhonchi or rales.  Abdominal:     General: Bowel sounds are normal.     Palpations:  Abdomen is soft. There is no hepatosplenomegaly or mass.     Tenderness: There is no abdominal tenderness. There is no guarding or rebound.  Musculoskeletal:        General: No tenderness or edema. Normal range of motion.     Cervical back: Normal range of motion and neck supple.  Lymphadenopathy:     Cervical: No cervical adenopathy.  Skin:    General: Skin is warm.     Findings: No rash.  Neurological:     Mental Status: She is alert and oriented to person, place, and time.     Cranial Nerves: No cranial nerve deficit.     Deep Tendon Reflexes: Strength normal. Reflexes normal.  Psychiatric:        Mood and Affect: Mood and affect normal. Mood is  not anxious or depressed.     Wt Readings from Last 3 Encounters:  04/24/20 193 lb (87.5 kg)  09/06/19 192 lb (87.1 kg)  08/19/19 193 lb (87.5 kg)    BP 120/80   Pulse 64   Temp 98.4 F (36.9 C) (Oral)   Ht 5\' 4"  (1.626 m)   Wt 193 lb (87.5 kg)   BMI 33.13 kg/m   Assessment and Plan:  1. Acute maxillary sinusitis, recurrence not specified New onset.  Persistent.  Stable.  Patient with exam and history consistent with a maxillary sinusitis.  We will initiate a azithromycin to 50 mg 2 today followed by 1 a day for 4 days. - azithromycin (ZITHROMAX) 250 MG tablet; 2 today then 1 a day for 4 days.  Dispense: 6 tablet; Refill: 0  2. Reactive depression Chronic.  Uncontrolled.  Stable.  Currently PHQ was 1 but anxiety as noted below is elevated.  We will increase sertraline to 75 mg 1 a day. - sertraline (ZOLOFT) 50 MG tablet; Take 1.5 tablets (75 mg total) by mouth daily.  Dispense: 45 tablet; Refill: 3  3. Anxiety Chronic.  Controlled.  Stable.  Gad score is 11.  Will increase sertraline to 75 mg once a day. - sertraline (ZOLOFT) 50 MG tablet; Take 1.5 tablets (75 mg total) by mouth daily.  Dispense: 45 tablet; Refill: 3  4. Panic attacks Patient is having panic attacks requiring an occasional 0.25 mg alprazolam.  Patient has  been given a refill on it at this time and has been cautioned to give time for the sertraline to become effective. - ALPRAZolam (XANAX) 0.25 MG tablet; Take 1 tablet (0.25 mg total) by mouth 2 (two) times daily as needed for anxiety.  Dispense: 30 tablet; Refill: 0

## 2020-04-24 NOTE — Telephone Encounter (Signed)
Copied from CRM [id number]. Topic: General - Other >> Apr 24, 2020  8:42 AM Jaquita Rector A wrote: Reason for CRM: Patient called in asking to speak to Delice Bison stated that she need to talk to her please call Ph# [phone]

## 2020-05-08 ENCOUNTER — Other Ambulatory Visit: Payer: Self-pay | Admitting: Family Medicine

## 2020-05-08 ENCOUNTER — Other Ambulatory Visit: Payer: Self-pay

## 2020-05-08 DIAGNOSIS — B009 Herpesviral infection, unspecified: Secondary | ICD-10-CM

## 2020-05-08 MED ORDER — VALACYCLOVIR HCL 1 G PO TABS: 1000.0000 mg | ORAL_TABLET | Freq: Two times a day (BID) | ORAL | 0 refills | Status: DC

## 2020-05-16 ENCOUNTER — Other Ambulatory Visit: Payer: Self-pay | Admitting: Family Medicine

## 2020-05-16 DIAGNOSIS — F419 Anxiety disorder, unspecified: Secondary | ICD-10-CM

## 2020-05-16 DIAGNOSIS — F329 Major depressive disorder, single episode, unspecified: Secondary | ICD-10-CM

## 2020-05-16 NOTE — Telephone Encounter (Signed)
Requested medication (s) are due for refill today: yes  Requested medication (s) are on the active medication list: yes  Last refill:  04/24/20 #45 3 refills  Future visit scheduled: no  Notes to clinic:  can do request for 90 days , pharmacy requesting Rx code needed     Requested Prescriptions  Pending Prescriptions Disp Refills   sertraline (ZOLOFT) 50 MG tablet [Pharmacy Med Name: SERTRALINE HCL 50 MG TABLET] 135 tablet 2    Sig: TAKE 1 AND 1/2 TABLETS BY MOUTH DAILY      Psychiatry:  Antidepressants - SSRI Passed - 05/16/2020  1:32 PM      Passed - Valid encounter within last 6 months    Recent Outpatient Visits           3 weeks ago Acute maxillary sinusitis, recurrence not specified   [facility] Duanne Limerick, MD   8 months ago Anxiety   [facility] Duanne Limerick, MD   9 months ago Chest pain due to GERD   [facility] Duanne Limerick, [facility]   2 years ago Cellulitis of mouth   [facility] Duanne Limerick, MD   3 years ago Infected cyst of skin   [facility] Duanne Limerick, MD

## 2020-06-22 ENCOUNTER — Other Ambulatory Visit: Payer: Self-pay | Admitting: Family Medicine

## 2020-06-22 DIAGNOSIS — F329 Major depressive disorder, single episode, unspecified: Secondary | ICD-10-CM

## 2020-06-22 DIAGNOSIS — F419 Anxiety disorder, unspecified: Secondary | ICD-10-CM

## 2020-06-22 NOTE — Telephone Encounter (Signed)
Requested medication (s) are due for refill today:   Pharmacy requesting a 90 day supply  Requested medication (s) are on the active medication list:   Yes  Future visit scheduled:   No   Last ordered: 05/18/2020 #45, 0 refills  Clinic note:  Returned because pharmacy requesting a 90 day supply and a DX Code   Requested Prescriptions  Pending Prescriptions Disp Refills   sertraline (ZOLOFT) 50 MG tablet [Pharmacy Med Name: SERTRALINE HCL 50 MG TABLET] 135 tablet 1    Sig: TAKE 1 AND 1/2 TABLETS DAILY BY MOUTH      Psychiatry:  Antidepressants - SSRI Passed - 06/22/2020 12:33 PM      Passed - Valid encounter within last 6 months    Recent Outpatient Visits           1 month ago Acute maxillary sinusitis, recurrence not specified   [facility] Duanne Limerick, MD   9 months ago Anxiety   [facility] Duanne Limerick, MD   10 months ago Chest pain due to GERD   [facility] Duanne Limerick, MD   2 years ago Cellulitis of mouth   [facility] Duanne Limerick, MD   3 years ago Infected cyst of skin   [facility] Duanne Limerick, MD

## 2020-08-10 ENCOUNTER — Telehealth: Payer: Self-pay

## 2020-08-10 NOTE — Telephone Encounter (Signed)
Spoke to her on phone

## 2020-08-10 NOTE — Telephone Encounter (Unsigned)
Copied from CRM [id number]. Topic: General - Inquiry >> Aug 10, 2020  9:21 AM Lynne Logan D wrote: Reason for CRM: Pt requesting callback from [place]. Did not give much information.

## 2020-08-26 ENCOUNTER — Other Ambulatory Visit: Payer: Self-pay | Admitting: Obstetrics and Gynecology

## 2020-08-26 DIAGNOSIS — Z30011 Encounter for initial prescription of contraceptive pills: Secondary | ICD-10-CM

## 2020-08-26 DIAGNOSIS — Z01419 Encounter for gynecological examination (general) (routine) without abnormal findings: Secondary | ICD-10-CM

## 2020-08-26 NOTE — Telephone Encounter (Signed)
Patient needs appointment prior to next refill.  

## 2020-09-25 ENCOUNTER — Other Ambulatory Visit: Payer: Self-pay | Admitting: Family Medicine

## 2020-09-25 DIAGNOSIS — F419 Anxiety disorder, unspecified: Secondary | ICD-10-CM

## 2020-09-25 DIAGNOSIS — F329 Major depressive disorder, single episode, unspecified: Secondary | ICD-10-CM

## 2020-09-25 NOTE — Telephone Encounter (Signed)
   Notes to clinic: review for refill Last appt was canceled because patient moved    Requested Prescriptions  Pending Prescriptions Disp Refills   sertraline (ZOLOFT) 50 MG tablet [Pharmacy Med Name: SERTRALINE HCL 50 MG TABLET] 135 tablet 0    Sig: TAKE 1 AND 1/2 TABLETS BY MOUTH DAILY      Psychiatry:  Antidepressants - SSRI Passed - 09/25/2020  2:36 PM      Passed - Valid encounter within last 6 months    Recent Outpatient Visits           5 months ago Acute maxillary sinusitis, recurrence not specified   [facility] Duanne Limerick, MD   1 year ago Anxiety   [facility] Duanne Limerick, MD   1 year ago Chest pain due to GERD   [facility] Duanne Limerick, MD   3 years ago Cellulitis of mouth   [facility] Duanne Limerick, MD   4 years ago Infected cyst of skin   [facility] Duanne Limerick, MD

## 2020-09-25 NOTE — Telephone Encounter (Signed)
Spoke with patient about her medicine refills she will call back next week.

## 2020-10-26 ENCOUNTER — Other Ambulatory Visit: Payer: Self-pay | Admitting: Internal Medicine

## 2020-10-26 DIAGNOSIS — F419 Anxiety disorder, unspecified: Secondary | ICD-10-CM

## 2020-10-26 DIAGNOSIS — F329 Major depressive disorder, single episode, unspecified: Secondary | ICD-10-CM

## 2020-10-26 NOTE — Telephone Encounter (Signed)
  Notes to clinic:  REQUEST FOR 90 DAYS PRESCRIPTION. DX Code Needed.   Requested Prescriptions  Pending Prescriptions Disp Refills   sertraline (ZOLOFT) 50 MG tablet [Pharmacy Med Name: SERTRALINE HCL 50 MG TABLET] 135 tablet 1    Sig: TAKE 1 AND 1/2 TABLETS DAILY BY MOUTH      Psychiatry:  Antidepressants - SSRI Failed - 10/26/2020  2:33 PM      Failed - Valid encounter within last 6 months    Recent Outpatient Visits           6 months ago Acute maxillary sinusitis, recurrence not specified   [facility] Duanne Limerick, MD   1 year ago Anxiety   [facility] Duanne Limerick, MD   1 year ago Chest pain due to GERD   [facility] Duanne Limerick, MD   3 years ago Cellulitis of mouth   [facility] Duanne Limerick, MD   4 years ago Infected cyst of skin   [facility] Duanne Limerick, MD

## 2020-10-27 NOTE — Telephone Encounter (Signed)
Left voice mail to set up appointment for medication refills

## 2020-11-09 ENCOUNTER — Ambulatory Visit: Payer: Self-pay | Admitting: Family Medicine

## 2020-11-13 ENCOUNTER — Other Ambulatory Visit: Payer: Self-pay | Admitting: Obstetrics and Gynecology

## 2020-11-13 DIAGNOSIS — Z01419 Encounter for gynecological examination (general) (routine) without abnormal findings: Secondary | ICD-10-CM

## 2020-11-13 DIAGNOSIS — Z30011 Encounter for initial prescription of contraceptive pills: Secondary | ICD-10-CM

## 2020-11-14 ENCOUNTER — Other Ambulatory Visit: Payer: Self-pay

## 2020-11-14 DIAGNOSIS — Z01419 Encounter for gynecological examination (general) (routine) without abnormal findings: Secondary | ICD-10-CM

## 2020-11-14 DIAGNOSIS — Z30011 Encounter for initial prescription of contraceptive pills: Secondary | ICD-10-CM

## 2020-11-16 ENCOUNTER — Encounter: Payer: Self-pay | Admitting: Family Medicine

## 2020-11-16 ENCOUNTER — Other Ambulatory Visit: Payer: Self-pay

## 2020-11-16 ENCOUNTER — Ambulatory Visit (INDEPENDENT_AMBULATORY_CARE_PROVIDER_SITE_OTHER): Payer: Self-pay | Admitting: Family Medicine

## 2020-11-16 VITALS — BP 112/84 | HR 97 | Temp 98.3°F | Ht 64.0 in | Wt 198.0 lb

## 2020-11-16 DIAGNOSIS — J4521 Mild intermittent asthma with (acute) exacerbation: Secondary | ICD-10-CM

## 2020-11-16 DIAGNOSIS — J0100 Acute maxillary sinusitis, unspecified: Secondary | ICD-10-CM

## 2020-11-16 DIAGNOSIS — F329 Major depressive disorder, single episode, unspecified: Secondary | ICD-10-CM

## 2020-11-16 DIAGNOSIS — R059 Cough, unspecified: Secondary | ICD-10-CM

## 2020-11-16 DIAGNOSIS — F410 Panic disorder [episodic paroxysmal anxiety] without agoraphobia: Secondary | ICD-10-CM

## 2020-11-16 MED ORDER — ALBUTEROL SULFATE HFA 108 (90 BASE) MCG/ACT IN AERS: 2.0000 | INHALATION_SPRAY | Freq: Four times a day (QID) | RESPIRATORY_TRACT | 2 refills | Status: AC | PRN

## 2020-11-16 MED ORDER — GUAIFENESIN-CODEINE 100-10 MG/5ML PO SYRP: 5.0000 mL | ORAL_SOLUTION | Freq: Four times a day (QID) | ORAL | 0 refills | Status: DC | PRN

## 2020-11-16 MED ORDER — AZITHROMYCIN 250 MG PO TABS: ORAL_TABLET | ORAL | 0 refills | Status: AC

## 2020-11-16 MED ORDER — SERTRALINE HCL 100 MG PO TABS: 100.0000 mg | ORAL_TABLET | Freq: Every day | ORAL | 3 refills | Status: DC

## 2020-11-16 MED ORDER — ALPRAZOLAM 0.25 MG PO TABS: 0.2500 mg | ORAL_TABLET | Freq: Two times a day (BID) | ORAL | 0 refills | Status: AC | PRN

## 2020-11-16 NOTE — Progress Notes (Signed)
Date:  11/16/2020   Name:  Kelsey Bryant   DOB:  17-May-1985   MRN:  [id number]   Chief Complaint: Cough (X 1 week- COVID test yesterday negative. Green production) and Depression  Cough This is a new problem. The current episode started 1 to 4 weeks ago. The problem has been gradually worsening. The cough is Productive of sputum (green). Associated symptoms include ear pain, headaches, nasal congestion, postnasal drip, a sore throat, shortness of breath and sweats. Pertinent negatives include no chest pain, chills, fever, heartburn, myalgias or wheezing. Associated symptoms comments: Prod yellow/green. The symptoms are aggravated by lying down. She has tried nothing for the symptoms. Her past medical history is significant for asthma.  Depression        This is a chronic problem.  The current episode started more than 1 year ago.   The problem occurs intermittently.The problem is unchanged.  Associated symptoms include fatigue, irritable, restlessness and headaches.  Associated symptoms include no decreased concentration, no helplessness, no hopelessness, does not have insomnia, no decreased interest, no appetite change, no body aches, no myalgias, no indigestion, not sad and no suicidal ideas.     The symptoms are aggravated by work stress.  Past treatments include SSRIs - Selective serotonin reuptake inhibitors.  Compliance with treatment is good.  Previous treatment provided moderate relief. Sinusitis This is a new problem. The current episode started today. The problem has been gradually worsening since onset. There has been no fever. The pain is moderate. Associated symptoms include coughing, ear pain, headaches, shortness of breath and a sore throat. Pertinent negatives include no chills.   Lab Results  Component Value Date   CREATININE 0.66 11/26/2015   BUN 8 11/26/2015   NA 138 11/26/2015   K 3.7 11/26/2015   CL 106 11/26/2015   CO2 25 11/26/2015   No results found for: CHOL,  HDL, LDLCALC, LDLDIRECT, TRIG, CHOLHDL No results found for: TSH No results found for: HGBA1C Lab Results  Component Value Date   WBC 8.9 11/26/2015   HGB 13.4 11/26/2015   HCT 39.0 11/26/2015   MCV 92.1 11/26/2015   PLT 208 11/26/2015   No results found for: ALT, AST, GGT, ALKPHOS, BILITOT   Review of Systems  Constitutional:  Positive for fatigue. Negative for appetite change, chills and fever.  HENT:  Positive for ear pain, postnasal drip and sore throat.   Respiratory:  Positive for cough and shortness of breath. Negative for wheezing.   Cardiovascular:  Negative for chest pain.  Gastrointestinal:  Negative for heartburn.  Musculoskeletal:  Negative for myalgias.  Neurological:  Positive for headaches.  Psychiatric/Behavioral:  Positive for depression. Negative for decreased concentration and suicidal ideas. The patient does not have insomnia.    There are no problems to display for this patient.   Allergies  Allergen Reactions   Ceftin [Cefuroxime]    Penicillins     Past Surgical History:  Procedure Laterality Date   INTRAUTERINE DEVICE (IUD) INSERTION  2012    Social History   Tobacco Use   Smoking status: Former   Smokeless tobacco: Never  Vaping Use   Vaping Use: Never used  Substance Use Topics   Alcohol use: Yes    Alcohol/week: 0.0 standard drinks   Drug use: No     Medication list has been reviewed and updated.  Current Meds  Medication Sig   ALPRAZolam (XANAX) 0.25 MG tablet Take 1 tablet (0.25 mg total) by mouth 2 (two)  times daily as needed for anxiety.   norgestimate-ethinyl estradiol (ORTHO-CYCLEN) 0.25-35 MG-MCG tablet TAKE 1 TABLET BY MOUTH EVERY DAY   sertraline (ZOLOFT) 50 MG tablet TAKE 1 AND 1/2 TABLETS DAILY BY MOUTH   valACYclovir (VALTREX) 1000 MG tablet Take 1 tablet (1,000 mg total) by mouth 2 (two) times daily.   [DISCONTINUED] pantoprazole (PROTONIX) 40 MG tablet TAKE 1 TABLET BY MOUTH EVERY DAY    PHQ 2/9 Scores 11/16/2020  04/24/2020 09/06/2019 08/19/2019  PHQ - 2 Score 0 0 3 0  PHQ- 9 Score 2 1 9  0    GAD 7 : Generalized Anxiety Score 11/16/2020 04/24/2020 09/06/2019 08/19/2019  Nervous, Anxious, on Edge 0 0 3 0  Control/stop worrying 2 3 3  0  Worry too much - different things 2 2 3  0  Trouble relaxing 3 1 2  0  Restless 3 2 0 0  Easily annoyed or irritable 3 3 3  0  Afraid - awful might happen 0 0 0 0  Total GAD 7 Score 13 11 14  0  Anxiety Difficulty Not difficult at all Very difficult Somewhat difficult -    BP Readings from Last 3 Encounters:  11/16/20 112/84  04/24/20 120/80  09/06/19 100/62    Physical Exam Vitals and nursing note reviewed.  Constitutional:      General: She is irritable. She is not in acute distress.    Appearance: She is not diaphoretic.  HENT:     Head: Normocephalic and atraumatic.     Right Ear: Tympanic membrane and external ear normal. There is no impacted cerumen.     Left Ear: Tympanic membrane and external ear normal. There is no impacted cerumen.     Nose: Congestion present. No rhinorrhea.  Eyes:     General:        Right eye: No discharge.        Left eye: No discharge.     Conjunctiva/sclera: Conjunctivae normal.     Pupils: Pupils are equal, round, and reactive to light.  Neck:     Thyroid: No thyromegaly.     Vascular: No JVD.  Cardiovascular:     Rate and Rhythm: Normal rate and regular rhythm.     Heart sounds: Normal heart sounds. No murmur heard.   No friction rub. No gallop.  Pulmonary:     Effort: Pulmonary effort is normal.     Breath sounds: Normal breath sounds. No wheezing or rhonchi.  Abdominal:     General: Bowel sounds are normal.     Palpations: Abdomen is soft. There is no mass.     Tenderness: There is no abdominal tenderness. There is no guarding.  Musculoskeletal:        General: Normal range of motion.     Cervical back: Normal range of motion and neck supple.  Lymphadenopathy:     Cervical: No cervical adenopathy.  Skin:     General: Skin is warm and dry.  Neurological:     Mental Status: She is alert.     Deep Tendon Reflexes: Reflexes are normal and symmetric.    Wt Readings from Last 3 Encounters:  11/16/20 198 lb (89.8 kg)  04/24/20 193 lb (87.5 kg)  09/06/19 192 lb (87.1 kg)    BP 112/84   Pulse 97   Temp 98.3 F (36.8 C) (Oral)   Ht 5\' 4"  (1.626 m)   Wt 198 lb (89.8 kg)   LMP 10/23/2020 (Approximate)   SpO2 99%   BMI 33.99 kg/m  Assessment and Plan:  1. Acute maxillary sinusitis, recurrence not specified New onset.  Persistent.  Stable.  Remains symptomatic.  We will initiate azithromycin to 50 mg 2 today followed by 1 a day for 4 days. - azithromycin (ZITHROMAX) 250 MG tablet; Take 2 tablets on day 1, then 1 tablet daily on days 2 through 5  Dispense: 6 tablet; Refill: 0  2. Mild intermittent reactive airway disease with acute exacerbation New recurrence.  Persistent.  Stable.  Patient had a past history of albuterol needs and we will refill the albuterol at this time given that the E/I is increased. - albuterol (VENTOLIN HFA) 108 (90 Base) MCG/ACT inhaler; Inhale 2 puffs into the lungs every 6 (six) hours as needed for wheezing or shortness of breath.  Dispense: 8 g; Refill: 2 - guaiFENesin-codeine (ROBITUSSIN AC) 100-10 MG/5ML syrup; Take 5 mLs by mouth 4 (four) times daily as needed for cough.  Dispense: 118 mL; Refill: 0  3. Reactive depression Chronic.  Controlled.  Stable.  PHQ is 2.  We will increase sertraline to 100 mg from 75 mg of which patient is currently on and will reevaluate. - sertraline (ZOLOFT) 100 MG tablet; Take 1 tablet (100 mg total) by mouth daily.  Dispense: 30 tablet; Refill: 3  4. Cough New onset.  Persistent.  Unable to sleep at night.  We will give Robitussin-AC teaspoon every 6 hours as needed cough. - guaiFENesin-codeine (ROBITUSSIN AC) 100-10 MG/5ML syrup; Take 5 mLs by mouth 4 (four) times daily as needed for cough.  Dispense: 118 mL; Refill: 0  5. Panic  attacks Chronic.  Episodic.  Currently in exacerbation.  With a gad score of 13.  We will refill the Xanax for panic attacks with an increase of her sertraline 200 mg. - ALPRAZolam (XANAX) 0.25 MG tablet; Take 1 tablet (0.25 mg total) by mouth 2 (two) times daily as needed for anxiety.  Dispense: 30 tablet; Refill: 0

## 2020-11-18 ENCOUNTER — Telehealth: Payer: Self-pay

## 2020-11-18 ENCOUNTER — Other Ambulatory Visit: Payer: Self-pay

## 2020-11-18 DIAGNOSIS — Z01419 Encounter for gynecological examination (general) (routine) without abnormal findings: Secondary | ICD-10-CM

## 2020-11-18 DIAGNOSIS — Z30011 Encounter for initial prescription of contraceptive pills: Secondary | ICD-10-CM

## 2020-11-18 NOTE — Telephone Encounter (Signed)
Patient 01/04/21 at 8:30 with ABC

## 2020-11-18 NOTE — Telephone Encounter (Signed)
[facility] RF sent. Pt aware. Advised pt it was important for her to show to her annual, no more [facility] RF's after today if she doesn't show up.

## 2020-11-18 NOTE — Telephone Encounter (Signed)
Pt is requesting birth control refill. Last annual was 05/2018. Pls schedule annual and send msg back to me so we can get her enough refill to last to her annual appt.

## 2020-12-08 ENCOUNTER — Other Ambulatory Visit: Payer: Self-pay | Admitting: Family Medicine

## 2020-12-08 DIAGNOSIS — F329 Major depressive disorder, single episode, unspecified: Secondary | ICD-10-CM

## 2020-12-08 NOTE — Telephone Encounter (Signed)
Requested medication (s) are due for refill today:   Yes  Requested medication (s) are on the active medication list:   Yes  Future visit scheduled:   Yes tomorrow 8/31   Last ordered: 11/16/2020 #30, 3 refills  Returned because pharmacy requesting a 90 day supply and a DX Code   Requested Prescriptions  Pending Prescriptions Disp Refills   sertraline (ZOLOFT) 100 MG tablet [Pharmacy Med Name: SERTRALINE HCL 100 MG TABLET] 90 tablet 2    Sig: TAKE 1 TABLET BY MOUTH EVERY DAY     Psychiatry:  Antidepressants - SSRI Passed - 12/08/2020 11:35 AM      Passed - Valid encounter within last 6 months    Recent Outpatient Visits           3 weeks ago Acute maxillary sinusitis, recurrence not specified   [facility] Duanne Limerick, MD   7 months ago Acute maxillary sinusitis, recurrence not specified   [facility] Duanne Limerick, MD   1 year ago Anxiety   [facility] Duanne Limerick, MD   1 year ago Chest pain due to GERD   [facility] Duanne Limerick, MD   3 years ago Cellulitis of mouth   [facility] Duanne Limerick, MD       Future Appointments             Tomorrow Duanne Limerick, MD [facility]   In 6 months Duanne Limerick, MD [facility]

## 2020-12-09 ENCOUNTER — Encounter: Payer: Self-pay | Admitting: Family Medicine

## 2020-12-09 ENCOUNTER — Ambulatory Visit (INDEPENDENT_AMBULATORY_CARE_PROVIDER_SITE_OTHER): Payer: 59 | Admitting: Family Medicine

## 2020-12-09 ENCOUNTER — Other Ambulatory Visit: Payer: Self-pay

## 2020-12-09 VITALS — BP 120/80 | HR 76 | Ht 64.0 in | Wt 195.0 lb

## 2020-12-09 DIAGNOSIS — F329 Major depressive disorder, single episode, unspecified: Secondary | ICD-10-CM

## 2020-12-09 DIAGNOSIS — F5101 Primary insomnia: Secondary | ICD-10-CM

## 2020-12-09 MED ORDER — SERTRALINE HCL 100 MG PO TABS: 100.0000 mg | ORAL_TABLET | Freq: Every day | ORAL | 1 refills | Status: DC

## 2020-12-09 MED ORDER — TRAZODONE HCL 50 MG PO TABS: 25.0000 mg | ORAL_TABLET | Freq: Every evening | ORAL | 5 refills | Status: DC | PRN

## 2020-12-09 NOTE — Progress Notes (Signed)
Date:  12/09/2020   Name:  Kelsey Bryant   DOB:  [birth year]   MRN:  [id number]   Chief Complaint: Depression (PHQ 2-4 and GAD 13-2)  Depression        This is a chronic problem.  The current episode started more than 1 year ago.   The onset quality is gradual.   The problem occurs rarely.  The problem has been gradually improving since onset.  Associated symptoms include fatigue and insomnia.  Associated symptoms include no decreased concentration, no helplessness, no hopelessness, not irritable, no restlessness, no decreased interest, no appetite change, no body aches, no myalgias, no headaches, no indigestion, not sad and no suicidal ideas.     The symptoms are aggravated by family issues.  Past treatments include SSRIs - Selective serotonin reuptake inhibitors.  Compliance with treatment is good.  Previous treatment provided moderate relief.  Lab Results  Component Value Date   CREATININE 0.66 11/26/2015   BUN 8 11/26/2015   NA 138 11/26/2015   K 3.7 11/26/2015   CL 106 11/26/2015   CO2 25 11/26/2015   No results found for: CHOL, HDL, LDLCALC, LDLDIRECT, TRIG, CHOLHDL No results found for: TSH No results found for: HGBA1C Lab Results  Component Value Date   WBC 8.9 11/26/2015   HGB 13.4 11/26/2015   HCT 39.0 11/26/2015   MCV 92.1 11/26/2015   PLT 208 11/26/2015   No results found for: ALT, AST, GGT, ALKPHOS, BILITOT   Review of Systems  Constitutional:  Positive for fatigue. Negative for appetite change, chills and fever.  HENT:  Negative for drooling, ear discharge, ear pain and sore throat.   Respiratory:  Negative for cough, shortness of breath and wheezing.   Cardiovascular:  Negative for chest pain, palpitations and leg swelling.  Gastrointestinal:  Negative for abdominal pain, blood in stool, constipation, diarrhea and nausea.  Endocrine: Negative for polydipsia.  Genitourinary:  Negative for dysuria, frequency, hematuria and urgency.  Musculoskeletal:   Negative for back pain, myalgias and neck pain.  Skin:  Negative for rash.  Allergic/Immunologic: Negative for environmental allergies.  Neurological:  Negative for dizziness and headaches.  Hematological:  Does not bruise/bleed easily.  Psychiatric/Behavioral:  Positive for depression. Negative for decreased concentration and suicidal ideas. The patient has insomnia. The patient is not nervous/anxious.    There are no problems to display for this patient.   Allergies  Allergen Reactions   Ceftin [Cefuroxime]    Penicillins     Past Surgical History:  Procedure Laterality Date   INTRAUTERINE DEVICE (IUD) INSERTION  2012    Social History   Tobacco Use   Smoking status: Former   Smokeless tobacco: Never  Vaping Use   Vaping Use: Never used  Substance Use Topics   Alcohol use: Yes    Alcohol/week: 0.0 standard drinks   Drug use: No     Medication list has been reviewed and updated.  Current Meds  Medication Sig   albuterol (VENTOLIN HFA) 108 (90 Base) MCG/ACT inhaler Inhale 2 puffs into the lungs every 6 (six) hours as needed for wheezing or shortness of breath.   ALPRAZolam (XANAX) 0.25 MG tablet Take 1 tablet (0.25 mg total) by mouth 2 (two) times daily as needed for anxiety.   norgestimate-ethinyl estradiol (ORTHO-CYCLEN) 0.25-35 MG-MCG tablet TAKE 1 TABLET BY MOUTH EVERY DAY *NEEDS APPT   sertraline (ZOLOFT) 100 MG tablet TAKE 1 TABLET BY MOUTH EVERY DAY   valACYclovir (VALTREX) 1000 MG tablet  Take 1 tablet (1,000 mg total) by mouth 2 (two) times daily.    PHQ 2/9 Scores 12/09/2020 11/16/2020 04/24/2020 09/06/2019  PHQ - 2 Score 0 0 0 3  PHQ- 9 Score 4 2 1 9     GAD 7 : Generalized Anxiety Score 12/09/2020 11/16/2020 04/24/2020 09/06/2019  Nervous, Anxious, on Edge 0 0 0 3  Control/stop worrying 0 2 3 3   Worry too much - different things 1 2 2 3   Trouble relaxing 0 3 1 2   Restless 0 3 2 0  Easily annoyed or irritable 1 3 3 3   Afraid - awful might happen 0 0 0 0   Total GAD 7 Score 2 13 11 14   Anxiety Difficulty Not difficult at all Not difficult at all Very difficult Somewhat difficult    BP Readings from Last 3 Encounters:  11/16/20 112/84  04/24/20 120/80  09/06/19 100/62    Physical Exam Vitals and nursing note reviewed.  Constitutional:      General: She is not irritable.    Appearance: She is well-developed.  HENT:     Head: Normocephalic.     Right Ear: External ear normal.     Left Ear: External ear normal.  Eyes:     General: Lids are everted, no foreign bodies appreciated. No scleral icterus.       Left eye: No foreign body or hordeolum.     Conjunctiva/sclera: Conjunctivae normal.     Right eye: Right conjunctiva is not injected.     Left eye: Left conjunctiva is not injected.     Pupils: Pupils are equal, round, and reactive to light.  Neck:     Thyroid: No thyromegaly.     Vascular: No JVD.     Trachea: No tracheal deviation.  Cardiovascular:     Rate and Rhythm: Normal rate and regular rhythm.     Heart sounds: Normal heart sounds. No murmur heard.   No friction rub. No gallop.  Pulmonary:     Effort: Pulmonary effort is normal. No respiratory distress.     Breath sounds: Normal breath sounds. No wheezing or rales.  Abdominal:     General: Bowel sounds are normal.     Palpations: Abdomen is soft. There is no mass.     Tenderness: There is no abdominal tenderness. There is no guarding or rebound.  Musculoskeletal:        General: No tenderness. Normal range of motion.     Cervical back: Normal range of motion and neck supple.  Lymphadenopathy:     Cervical: No cervical adenopathy.  Skin:    General: Skin is warm.     Findings: No rash.  Neurological:     Mental Status: She is alert and oriented to person, place, and time.     Cranial Nerves: No cranial nerve deficit.     Deep Tendon Reflexes: Reflexes normal.  Psychiatric:        Mood and Affect: Mood is not anxious or depressed.    Wt Readings from Last  3 Encounters:  12/09/20 195 lb (88.5 kg)  11/16/20 198 lb (89.8 kg)  04/24/20 193 lb (87.5 kg)    Ht 5\' 4"  (1.626 m)   Wt 195 lb (88.5 kg)   LMP 11/12/2020 (Approximate)   BMI 33.47 kg/m   Assessment and Plan: 1. Reactive depression Relatively new onset.  Stable.  Controlled.  PHQ went from a 13 to a 2.  With a gad score of 4 now.  We will continue sertraline 100 mg once a day and will recheck in 6 months. - sertraline (ZOLOFT) 100 MG tablet; Take 1 tablet (100 mg total) by mouth daily.  Dispense: 90 tablet; Refill: 1  2. Primary insomnia Relatively new onset also.  Uncontrolled.  Stable.  Patient is having difficulty initiating sleep and we will trial trazodone 50 mg 1/2 to 1 tablet nightly. - traZODone (DESYREL) 50 MG tablet; Take 0.5-1 tablets (25-50 mg total) by mouth at bedtime as needed for sleep.  Dispense: 30 tablet; Refill: 5

## 2020-12-31 NOTE — Progress Notes (Signed)
PCP:  Juline Patch, MD   Chief Complaint  Patient presents with   Gynecologic Exam    Night sweats x 2-3 months     HPI:      [name] is a 35 y.o. [id number] whose LMP was Patient's last menstrual period was 12/14/2020 (exact date)., presents today for her annual examination.  Her menses are regular every 28-30 days, lasting 4-5 days on OCPs.  Dysmenorrhea mild, occurring first 1-2 days of flow. She does not usually have intermenstrual bleeding but has this past pill pack without late/missed pills. Pt also with drenching night sweats several times a week past few months. Has been under significant amount of stress. Now on anxiety med and trazodone for sleep.   Sex activity: single partner, contraception - OCP (estrogen/progesterone). Has dyspareunia for past yr, occas PC spotting for a few hrs. No vag dryness. Hx of ovar cysts.  Last Pap: 06/06/18 Results were: no abnormalities /neg HPV DNA  Hx of STDs: none  There is a FH of breast cancer in her MGM, pancreatic cancer in her mat uncle and colon cancer in her mat aunt, genetic testing not done. There is no FH of ovarian cancer. The patient does not do self-breast exams.  Tobacco use: quit cigs, occas vapes Alcohol use: none No drug use.  Exercise: moderately active  She does not get adequate calcium and Vitamin D in her diet. Would like Rx for phentermine for wt loss before her daughter's wedding. Has worked in the past.   Past Medical History:  Diagnosis Date   Anxiety    Insomnia    Ovarian cyst     Past Surgical History:  Procedure Laterality Date   INTRAUTERINE DEVICE (IUD) INSERTION  2012    Family History  Problem Relation Age of Onset   Breast cancer Maternal Grandmother 70   Pancreatic cancer Maternal Uncle 60   Colon cancer Maternal Aunt 60    Social History   Socioeconomic History   Marital status: Single    Spouse name: Not on file   Number of children: Not on file   Years of education:  Not on file   Highest education level: Not on file  Occupational History   Not on file  Tobacco Use   Smoking status: Former   Smokeless tobacco: Never  Vaping Use   Vaping Use: Never used  Substance and Sexual Activity   Alcohol use: Yes    Alcohol/week: 0.0 standard drinks   Drug use: No   Sexual activity: Yes    Birth control/protection: Pill  Other Topics Concern   Not on file  Social History Narrative   Not on file   Social Determinants of Health   Financial Resource Strain: Not on file  Food Insecurity: Not on file  Transportation Needs: Not on file  Physical Activity: Not on file  Stress: Not on file  Social Connections: Not on file  Intimate Partner Violence: Not on file     Current Outpatient Medications:    albuterol (VENTOLIN HFA) 108 (90 Base) MCG/ACT inhaler, Inhale 2 puffs into the lungs every 6 (six) hours as needed for wheezing or shortness of breath., Disp: 8 g, Rfl: 2   ALPRAZolam (XANAX) 0.25 MG tablet, Take 1 tablet (0.25 mg total) by mouth 2 (two) times daily as needed for anxiety., Disp: 30 tablet, Rfl: 0   phentermine (ADIPEX-P) 37.5 MG tablet, Take 1 tablet (37.5 mg total) by mouth daily before breakfast., Disp:  30 tablet, Rfl: 1   sertraline (ZOLOFT) 100 MG tablet, Take 1 tablet (100 mg total) by mouth daily., Disp: 90 tablet, Rfl: 1   traZODone (DESYREL) 50 MG tablet, Take 0.5-1 tablets (25-50 mg total) by mouth at bedtime as needed for sleep., Disp: 30 tablet, Rfl: 5   valACYclovir (VALTREX) 1000 MG tablet, Take 1 tablet (1,000 mg total) by mouth 2 (two) times daily., Disp: 8 tablet, Rfl: 0   norgestimate-ethinyl estradiol (ORTHO-CYCLEN) 0.25-35 MG-MCG tablet, TAKE 1 TABLET BY MOUTH EVERY DAY, Disp: 84 tablet, Rfl: 3   ROS:  Review of Systems  Constitutional:  Positive for fatigue. Negative for fever and unexpected weight change.  Respiratory:  Negative for cough, shortness of breath and wheezing.   Cardiovascular:  Negative for chest pain,  palpitations and leg swelling.  Gastrointestinal:  Negative for blood in stool, constipation, diarrhea, nausea and vomiting.  Endocrine: Negative for cold intolerance, heat intolerance and polyuria.  Genitourinary:  Positive for dyspareunia. Negative for dysuria, flank pain, frequency, genital sores, hematuria, menstrual problem, pelvic pain, urgency, vaginal bleeding, vaginal discharge and vaginal pain.  Musculoskeletal:  Positive for arthralgias. Negative for back pain, joint swelling and myalgias.  Skin:  Negative for rash.  Neurological:  Negative for dizziness, syncope, light-headedness, numbness and headaches.  Hematological:  Negative for adenopathy.  Psychiatric/Behavioral:  Positive for agitation and sleep disturbance. Negative for confusion and suicidal ideas. The patient is not nervous/anxious.   BREAST: No symptoms   Objective: BP 104/70   Ht '5\' 4"'  (1.626 m)   Wt 197 lb (89.4 kg)   LMP 12/14/2020 (Exact Date)   BMI 33.81 kg/m    Physical Exam Constitutional:      Appearance: She is well-developed.  Genitourinary:     Vulva normal.     Right Labia: No rash, tenderness or lesions.    Left Labia: No tenderness, lesions or rash.    No vaginal discharge, erythema or tenderness.      Right Adnexa: not tender and no mass present.    Left Adnexa: not tender and no mass present.    No cervical friability or polyp.     Uterus is tender.     Uterus is not enlarged.  Breasts:    Right: No mass, nipple discharge, skin change or tenderness.     Left: No mass, nipple discharge, skin change or tenderness.  Neck:     Thyroid: No thyromegaly.  Cardiovascular:     Rate and Rhythm: Normal rate and regular rhythm.     Heart sounds: Normal heart sounds. No murmur heard. Pulmonary:     Effort: Pulmonary effort is normal.     Breath sounds: Normal breath sounds.  Abdominal:     Palpations: Abdomen is soft.     Tenderness: There is abdominal tenderness in the suprapubic area. There  is no guarding or rebound.  Musculoskeletal:        General: Normal range of motion.     Cervical back: Normal range of motion.  Lymphadenopathy:     Cervical: No cervical adenopathy.  Neurological:     General: No focal deficit present.     Mental Status: She is alert and oriented to person, place, and time.     Cranial Nerves: No cranial nerve deficit.  Skin:    General: Skin is warm and dry.  Psychiatric:        Mood and Affect: Mood normal.        Behavior: Behavior normal.  Thought Content: Thought content normal.        Judgment: Judgment normal.  Vitals reviewed.    Assessment/Plan: Encounter for annual routine gynecological examination  Encounter for surveillance of contraceptive pills - Plan: norgestimate-ethinyl estradiol (ORTHO-CYCLEN) 0.25-35 MG-MCG tablet; OCP RF  Breakthrough bleeding on OCPs-- Most likely due to generic pills or stress. Checking labs and GYN u/s anyway. F/u if sx persist.   Family history of breast cancer - Plan: [facility] ([place]); MyRisk testing discussed and done today  Dyspareunia in female - Plan: US PELVIS TRANSVAGINAL NON-OB (TV ONLY); tender on exam. Check GYN u/s, will call with results.   Unexplained night sweats - Plan: TSH, Comprehensive metabolic panel, CBC with Differential/Platelet; check labs. If neg, most likely due to stress. F/u prn.  Thyroid disorder screening - Plan: TSH  Weight loss - Plan: phentermine (ADIPEX-P) 37.5 MG tablet; Rx phentermine for 2 months. F/u prn.   Meds ordered this encounter  Medications   phentermine (ADIPEX-P) 37.5 MG tablet    Sig: Take 1 tablet (37.5 mg total) by mouth daily before breakfast.    Dispense:  30 tablet    Refill:  1    Order Specific Question:   Supervising Provider    Answer:   Gae Dry [[id number]]   norgestimate-ethinyl estradiol (ORTHO-CYCLEN) 0.25-35 MG-MCG tablet    Sig: TAKE 1 TABLET BY MOUTH EVERY DAY    Dispense:  84 tablet     Refill:  3    DX Code Needed  .    Order Specific Question:   Supervising Provider    Answer:   Gae Dry [[id number]]              GYN counsel breast self exam, mammography screening, menopause, adequate intake of calcium and vitamin D, diet and exercise     F/U  Return in about 1 year (around 01/04/2022).  Chavis Tessler B. Grettel Rames, PA-C 01/04/2021 10:41 AM

## 2021-01-04 ENCOUNTER — Other Ambulatory Visit: Payer: Self-pay

## 2021-01-04 ENCOUNTER — Encounter: Payer: Self-pay | Admitting: Obstetrics and Gynecology

## 2021-01-04 ENCOUNTER — Ambulatory Visit (INDEPENDENT_AMBULATORY_CARE_PROVIDER_SITE_OTHER): Payer: 59 | Admitting: Obstetrics and Gynecology

## 2021-01-04 VITALS — BP 104/70 | Ht 64.0 in | Wt 197.0 lb

## 2021-01-04 DIAGNOSIS — Z1329 Encounter for screening for other suspected endocrine disorder: Secondary | ICD-10-CM

## 2021-01-04 DIAGNOSIS — Z3041 Encounter for surveillance of contraceptive pills: Secondary | ICD-10-CM

## 2021-01-04 DIAGNOSIS — N921 Excessive and frequent menstruation with irregular cycle: Secondary | ICD-10-CM | POA: Diagnosis not present

## 2021-01-04 DIAGNOSIS — Z803 Family history of malignant neoplasm of breast: Secondary | ICD-10-CM | POA: Diagnosis not present

## 2021-01-04 DIAGNOSIS — Z01419 Encounter for gynecological examination (general) (routine) without abnormal findings: Secondary | ICD-10-CM

## 2021-01-04 DIAGNOSIS — N941 Unspecified dyspareunia: Secondary | ICD-10-CM

## 2021-01-04 DIAGNOSIS — R634 Abnormal weight loss: Secondary | ICD-10-CM

## 2021-01-04 DIAGNOSIS — R61 Generalized hyperhidrosis: Secondary | ICD-10-CM

## 2021-01-04 MED ORDER — PHENTERMINE HCL 37.5 MG PO TABS: 37.5000 mg | ORAL_TABLET | Freq: Every day | ORAL | 1 refills | Status: DC

## 2021-01-04 MED ORDER — NORGESTIMATE-ETH ESTRADIOL 0.25-35 MG-MCG PO TABS: ORAL_TABLET | ORAL | 3 refills | Status: DC

## 2021-01-04 NOTE — Patient Instructions (Signed)
I value your feedback and you entrusting us with your care. If you get a [facility] patient survey, I would appreciate you taking the time to let us know about your experience today. Thank you! ? ? ?

## 2021-01-05 ENCOUNTER — Other Ambulatory Visit: Payer: Self-pay | Admitting: Family Medicine

## 2021-01-05 DIAGNOSIS — F5101 Primary insomnia: Secondary | ICD-10-CM

## 2021-01-05 LAB — COMPREHENSIVE METABOLIC PANEL
ALT: 13 IU/L (ref 0–32)
AST: 22 IU/L (ref 0–40)
Albumin/Globulin Ratio: 1.8 (ref 1.2–2.2)
Albumin: 4.2 g/dL (ref 3.8–4.8)
Alkaline Phosphatase: 58 IU/L (ref 44–121)
BUN/Creatinine Ratio: 12 (ref 9–23)
BUN: 9 mg/dL (ref 6–20)
Bilirubin Total: 0.5 mg/dL (ref 0.0–1.2)
CO2: 19 mmol/L — ABNORMAL LOW (ref 20–29)
Calcium: 8.7 mg/dL (ref 8.7–10.2)
Chloride: 104 mmol/L (ref 96–106)
Creatinine, Ser: 0.76 mg/dL (ref 0.57–1.00)
Globulin, Total: 2.3 g/dL (ref 1.5–4.5)
Glucose: 80 mg/dL (ref 70–99)
Potassium: 4.5 mmol/L (ref 3.5–5.2)
Sodium: 137 mmol/L (ref 134–144)
Total Protein: 6.5 g/dL (ref 6.0–8.5)
eGFR: 105 mL/min/{1.73_m2} (ref >59)

## 2021-01-05 LAB — CBC WITH DIFFERENTIAL/PLATELET
Basophils Absolute: 0.1 10*3/uL (ref 0.0–0.2)
Basos: 1 %
EOS (ABSOLUTE): 0.1 10*3/uL (ref 0.0–0.4)
Eos: 1 %
Hematocrit: 39.7 % (ref 34.0–46.6)
Hemoglobin: 13.3 g/dL (ref 11.1–15.9)
Immature Grans (Abs): 0 10*3/uL (ref 0.0–0.1)
Immature Granulocytes: 0 %
Lymphocytes Absolute: 1.7 10*3/uL (ref 0.7–3.1)
Lymphs: 25 %
MCH: 30.3 pg (ref 26.6–33.0)
MCHC: 33.5 g/dL (ref 31.5–35.7)
MCV: 90 fL (ref 79–97)
Monocytes Absolute: 0.4 10*3/uL (ref 0.1–0.9)
Monocytes: 5 %
Neutrophils Absolute: 4.8 10*3/uL (ref 1.4–7.0)
Neutrophils: 68 %
Platelets: 272 10*3/uL (ref 150–450)
RBC: 4.39 x10E6/uL (ref 3.77–5.28)
RDW: 12.3 % (ref 11.7–15.4)
WBC: 7 10*3/uL (ref 3.4–10.8)

## 2021-01-05 LAB — TSH: TSH: 1.44 u[IU]/mL (ref 0.450–4.500)

## 2021-01-09 DIAGNOSIS — Z803 Family history of malignant neoplasm of breast: Secondary | ICD-10-CM

## 2021-01-09 DIAGNOSIS — Z1371 Encounter for nonprocreative screening for genetic disease carrier status: Secondary | ICD-10-CM

## 2021-01-09 HISTORY — DX: Encounter for nonprocreative screening for genetic disease carrier status: Z13.71

## 2021-01-09 HISTORY — DX: Family history of malignant neoplasm of breast: Z80.3

## 2021-01-13 NOTE — Addendum Note (Signed)
Addended by: Althea Grimmer B on: 01/13/2021 02:21 PM   Modules accepted: Orders

## 2021-01-14 ENCOUNTER — Encounter: Payer: Self-pay | Admitting: Obstetrics and Gynecology

## 2021-01-20 ENCOUNTER — Ambulatory Visit: Payer: 59

## 2021-01-20 DIAGNOSIS — N941 Unspecified dyspareunia: Secondary | ICD-10-CM

## 2021-01-21 ENCOUNTER — Other Ambulatory Visit: Payer: Self-pay

## 2021-01-21 ENCOUNTER — Ambulatory Visit
Admission: RE | Admit: 2021-01-21 | Discharge: 2021-01-21 | Disposition: A | Discharge status: Home / Self Care | Payer: 59 | Source: Ambulatory Visit | Attending: Obstetrics and Gynecology | Admitting: Obstetrics and Gynecology

## 2021-01-21 DIAGNOSIS — N941 Unspecified dyspareunia: Secondary | ICD-10-CM | POA: Diagnosis present

## 2021-01-22 NOTE — Progress Notes (Signed)
Pls call pt to sched appt with MD for pelvic pain/dyspareunia. Thx.

## 2021-01-26 ENCOUNTER — Telehealth: Payer: Self-pay | Admitting: Obstetrics and Gynecology

## 2021-01-26 NOTE — Telephone Encounter (Signed)
Pt aware of neg MyRisk results. IBIS=12.8%/riskscore=9.8%. No further screening recommended at this time.  Patient understands these results only apply to her and her children, and this is not indicative of genetic testing results of her other family members. It is recommended that her other family members have genetic testing done.  Pt also understands negative genetic testing doesn't mean she will never get any of these cancers.   Hard copy mailed to pt. F/u prn.

## 2021-02-04 ENCOUNTER — Other Ambulatory Visit: Payer: Self-pay | Admitting: Family Medicine

## 2021-02-04 DIAGNOSIS — F5101 Primary insomnia: Secondary | ICD-10-CM

## 2021-02-04 NOTE — Telephone Encounter (Signed)
Requested medications are due for refill today yes  Requested medications are on the active medication list yes  Last refill 01/10/21  Last visit 12/09/20  Future visit scheduled 06/08/21  Notes to clinic Pharmacy requesting DX code, please assess.  Requested Prescriptions  Pending Prescriptions Disp Refills   traZODone (DESYREL) 50 MG tablet [Pharmacy Med Name: TRAZODONE 50 MG TABLET] 90 tablet 2    Sig: TAKE 0.5-1 TABLETS BY MOUTH AT BEDTIME AS NEEDED FOR SLEEP.     Psychiatry: Antidepressants - Serotonin Modulator Passed - 02/04/2021  9:31 AM      Passed - Valid encounter within last 6 months    Recent Outpatient Visits           1 month ago Reactive depression   [facility] Duanne Limerick, MD   2 months ago Acute maxillary sinusitis, recurrence not specified   [facility] Duanne Limerick, MD   9 months ago Acute maxillary sinusitis, recurrence not specified   [facility] Duanne Limerick, MD   1 year ago Anxiety   [facility] Duanne Limerick, MD   1 year ago Chest pain due to GERD   [facility] Duanne Limerick, MD       Future Appointments             In 4 months Duanne Limerick, MD [facility]

## 2021-02-08 ENCOUNTER — Ambulatory Visit: Payer: 59 | Admitting: Obstetrics and Gynecology

## 2021-03-08 ENCOUNTER — Telehealth: Payer: Self-pay

## 2021-03-08 ENCOUNTER — Other Ambulatory Visit: Payer: Self-pay | Admitting: Obstetrics and Gynecology

## 2021-03-08 DIAGNOSIS — R634 Abnormal weight loss: Secondary | ICD-10-CM

## 2021-03-08 MED ORDER — PHENTERMINE HCL 37.5 MG PO TABS: 37.5000 mg | ORAL_TABLET | Freq: Every day | ORAL | 0 refills | Status: DC

## 2021-03-08 NOTE — Telephone Encounter (Signed)
Patient r/s her appointment to 04/13/20. Requesting refill of phentermine. [facility] [place] [phone]

## 2021-03-08 NOTE — Progress Notes (Signed)
Rx RF phentermine for 1 more mo. Has lost 18# so far.

## 2021-03-08 NOTE — Telephone Encounter (Signed)
Rx RF for 1 mo month eRxd.

## 2021-03-08 NOTE — Telephone Encounter (Signed)
Since pt's had neg GYN u/s 9/22 and still having pain, needs appt with MD for possible surg consult and not me.  Also, how much wt has she lost with phentermine? We typically only do a few months so that pt's new diet/exercise changes can cont to work.

## 2021-03-09 ENCOUNTER — Ambulatory Visit: Payer: 59 | Admitting: Obstetrics and Gynecology

## 2021-04-07 ENCOUNTER — Telehealth: Payer: Self-pay

## 2021-04-07 NOTE — Telephone Encounter (Signed)
Pt calling; has appt week after next; started her period this am; is hurting so bad; feels like insides are coming out; didn't know if could be seen sooner; bleeding really bad; clotting; everything..  [phone]  Pt says she is close to saturating a pad every to one hour.  Adv it's our policy that she go to the ED.

## 2021-04-13 ENCOUNTER — Ambulatory Visit: Payer: 59 | Admitting: Obstetrics and Gynecology

## 2021-04-15 DIAGNOSIS — R6884 Jaw pain: Secondary | ICD-10-CM | POA: Diagnosis not present

## 2021-04-15 DIAGNOSIS — S0993XA Unspecified injury of face, initial encounter: Secondary | ICD-10-CM | POA: Diagnosis not present

## 2021-04-15 DIAGNOSIS — S0083XA Contusion of other part of head, initial encounter: Secondary | ICD-10-CM | POA: Diagnosis not present

## 2021-04-19 ENCOUNTER — Other Ambulatory Visit: Payer: Self-pay

## 2021-04-19 ENCOUNTER — Ambulatory Visit (INDEPENDENT_AMBULATORY_CARE_PROVIDER_SITE_OTHER): Payer: Self-pay | Admitting: Obstetrics and Gynecology

## 2021-04-19 ENCOUNTER — Encounter: Payer: Self-pay | Admitting: Obstetrics and Gynecology

## 2021-04-19 VITALS — BP 109/72 | Ht 64.0 in | Wt 170.6 lb

## 2021-04-19 DIAGNOSIS — N941 Unspecified dyspareunia: Secondary | ICD-10-CM

## 2021-04-19 DIAGNOSIS — R1030 Lower abdominal pain, unspecified: Secondary | ICD-10-CM

## 2021-04-19 DIAGNOSIS — N730 Acute parametritis and pelvic cellulitis: Secondary | ICD-10-CM | POA: Diagnosis not present

## 2021-04-19 MED ORDER — DOXYCYCLINE HYCLATE 100 MG PO CAPS: 100.0000 mg | ORAL_CAPSULE | Freq: Two times a day (BID) | ORAL | 0 refills | Status: DC

## 2021-04-19 MED ORDER — METRONIDAZOLE 500 MG PO TABS
500.0000 mg | ORAL_TABLET | Freq: Two times a day (BID) | ORAL | 0 refills | Status: AC
Start: 2021-04-19 — End: 2021-05-03

## 2021-04-19 NOTE — Patient Instructions (Signed)
Pelvic Inflammatory Disease Pelvic inflammatory disease (PID) is an infection in some or all of the female reproductive organs. The infection can be in the uterus, ovaries, fallopian tubes, or the surrounding tissues in the pelvis. PID can cause abdominal or pelvic pain that comes on suddenly (acute pelvic pain). PID is a serious infection because it can lead to lasting (chronic) pelvic pain or the inability to have children (infertility). What are the causes? This condition is most often caused by bacteria that is spread during sexual contact. It can also be caused by a bacterial infection of the vagina (bacterial vaginosis) that is not spread by sexual contact. This condition occurs when the infection is not treated and the bacteria travel upward from the vagina or cervix into the reproductive organs. Bacteria may also be introduced into the reproductive organs following: The birth of a baby. A miscarriage. An abortion. Pelvic procedures or surgery. The insertion of an intrauterine device (IUD). A sexual assault. What increases the risk? You are more likely to develop this condition if you: Are younger than 37 years of age. Are sexually active at a young age. Have a history of STI (sexually transmitted infection) or PID. Have unprotected sex or do not use contraceptive barrier methods, such as condoms. Have multiple sexual partners. Have sex with someone who has symptoms of an STI. Use a douche. What are the signs or symptoms? Symptoms of this condition include: Abdominal or pelvic pain. Fever or chills. Abnormal vaginal discharge. Abnormal uterine bleeding. Unusual pain shortly after the end of a menstrual period. Pain with urination or sex. Feeling nauseous or vomiting. How is this diagnosed? This condition may be diagnosed based on: Your medical history. A pelvic exam. This exam can reveal signs of infection, inflammation, and discharge in the vagina and the surrounding tissues.  It can also help to identify painful areas. You may also have tests, including: A pregnancy test. Blood tests. A urine test. Culture tests of the vagina and cervix to check for an STI. Ultrasound. A laparoscopic procedure to look inside the pelvis. Biopsies of the lining of the uterus (endometrial biopsy). How is this treated? This condition may be treated with: Antibiotic medicines taken by mouth (orally). For more severe cases, antibiotics may be given through an IV at the hospital. Efforts to stop the spread of the infection. Sexual partners may need to be treated if the infection is caused by an STI. Surgery. This is rare. Surgery may be needed if other treatments do not help. It may take weeks until you are completely well. Your health care provider may test you for infection again 3 months after treatment. If you are diagnosed with PID, you should also be checked for HIV (human immunodeficiency virus). Follow these instructions at home: Take over-the-counter and prescription medicines only as told by your health care provider. If you were prescribed an antibiotic medicine, take it as told by your health care provider. Do not stop using the antibiotic even if you start to feel better. Do not have sex until treatment is completed or as told by your health care provider. If PID is confirmed, your recent sexual partners will need treatment, especially if you had unprotected sex. Keep all follow-up visits. This is important. Contact a health care provider if: You have increased or abnormal vaginal discharge. Your pain does not improve. You have a fever or chills. You cannot tolerate your medicines. Your partner has an STI. You have pain when you urinate. Get help right  away if: You have increased abdominal or pelvic pain. Your symptoms are getting worse. Your symptoms are not better in 72 hours with treatment. Summary Pelvic inflammatory disease (PID) is caused by an infection in  some or all of the female reproductive organs. PID is a serious infection because it can lead to lasting (chronic) pelvic pain or the inability to have children (infertility). This infection is usually treated with antibiotic medicines. Do not have sex until treatment is completed or as told by your health care provider. This information is not intended to replace advice given to you by your health care provider. Make sure you discuss any questions you have with your health care provider. Document Revised: 08/06/2020 Document Reviewed: 08/06/2020 Elsevier Patient Education  2022 [employer].

## 2021-04-19 NOTE — Progress Notes (Signed)
Patient ID: Kelsey Bryant, female   DOB: 1985-10-01, 35 y.o.   MRN: [id number]  Reason for Consult: Gynecologic Exam   Referred by Juline Patch, MD  Subjective:     HPI:  Kelsey Bryant is a 36 y.o. female she presents today with complaints of worsening pelvic pain.  She reports that this pain has been present and worsening for the last year.  She feels like the pain becomes progressively worse with each menstrual cycle.  She reports that the pain has seemed constant for the last 2 weeks.  She describes it as a sharp stabbing cramping pain.  She feels like the pain is worse with sitting and somewhat improves with activity.  She feels the pain throughout the day.  She reports that she is also having painful bowel movements.  She is having stools about once a day.  She endorses constipation.  She denies diarrhea nausea or vomiting.  She denies dysuria.  She reports extreme pain with intercourse.  She does have a history of sciatic back pain.  She works as a Engineer, building services.  She denies any pelvic trauma.  She has not had any new sexual partners.  Reports she has had the same sexual partner for 25 years.  Her last vaginal delivery was over 17 years ago.  She reports pain even with insertion of tampons.  She is currently taking a oral contraceptive birth control pill.  She has been on this pill for 2 years.  Prior to this she was using an IUD.  Gynecological History  Patient's last menstrual period was 04/12/2021. Menarche: 10  She denies passage of large clots She reports sensations of gushing or flooding of blood. She reports accidents where she bleeds through her clothing. She denies that she changes a saturated pad or tampon more frequently than every hour.  She reports that pain from her periods limits her activities.  History of fibroids, polyps, or ovarian cysts? : yes  History of PCOS? no Hstory of Endometriosis? no History of abnormal pap smears? no Have you had any sexually  transmitted infections in the past? no  She denies HPV vaccination in the past. She declines HPV vaccination  Last Pap: Results were: 06/06/2018 NIL She identifies as a female. She is sexually active with men.   She has dyspareunia. She has postcoital bleeding.  She currently uses OCP (estrogen/progesterone) for contraception.   Obstetrical History OB History  Gravida Para Term Preterm AB Living  '2 2 2     2  ' SAB IAB Ectopic Multiple Live Births          2    # Outcome Date GA Lbr Len/2nd Weight Sex Delivery Anes PTL Lv  2 Term 12/14/03    M Vag-Spont  N LIV  1 Term 07/16/01     Vag-Spont  N LIV     Past Medical History:  Diagnosis Date   Anxiety    BRCA negative 01/2021   MyRisk neg except AXIN2 and MSH2 VUS   Family history of breast cancer 01/2021   IBIS=12.8%.riskscore=9.8%   Insomnia    Ovarian cyst    Family History  Problem Relation Age of Onset   Breast cancer Maternal Grandmother 70   Pancreatic cancer Maternal Uncle 60   Colon cancer Maternal Aunt 60   Past Surgical History:  Procedure Laterality Date   INTRAUTERINE DEVICE (IUD) INSERTION  2012    Short Social History:  Social History   Tobacco Use  Smoking status: Former   Smokeless tobacco: Never  Substance Use Topics   Alcohol use: Yes    Alcohol/week: 0.0 standard drinks    Allergies  Allergen Reactions   Ceftin [Cefuroxime]    Penicillins     Current Outpatient Medications  Medication Sig Dispense Refill   albuterol (VENTOLIN HFA) 108 (90 Base) MCG/ACT inhaler Inhale 2 puffs into the lungs every 6 (six) hours as needed for wheezing or shortness of breath. 8 g 2   ALPRAZolam (XANAX) 0.25 MG tablet Take 1 tablet (0.25 mg total) by mouth 2 (two) times daily as needed for anxiety. 30 tablet 0   doxycycline (VIBRAMYCIN) 100 MG capsule Take 1 capsule (100 mg total) by mouth 2 (two) times daily for 14 days. 28 capsule 0   metroNIDAZOLE (FLAGYL) 500 MG tablet Take 1 tablet (500 mg total) by  mouth 2 (two) times daily for 14 days. 28 tablet 0   norgestimate-ethinyl estradiol (ORTHO-CYCLEN) 0.25-35 MG-MCG tablet TAKE 1 TABLET BY MOUTH EVERY DAY 84 tablet 3   sertraline (ZOLOFT) 100 MG tablet Take 1 tablet (100 mg total) by mouth daily. 90 tablet 1   traZODone (DESYREL) 50 MG tablet TAKE 0.5-1 TABLETS BY MOUTH AT BEDTIME AS NEEDED FOR SLEEP. 90 tablet 0   valACYclovir (VALTREX) 1000 MG tablet Take 1 tablet (1,000 mg total) by mouth 2 (two) times daily. 8 tablet 0   phentermine (ADIPEX-P) 37.5 MG tablet Take 1 tablet (37.5 mg total) by mouth daily before breakfast. (Patient not taking: Reported on 04/19/2021) 30 tablet 0   No current facility-administered medications for this visit.    Review of Systems  Constitutional: Negative for chills, fatigue, fever and unexpected weight change.  HENT: Negative for trouble swallowing.  Eyes: Negative for loss of vision.  Respiratory: Negative for cough, shortness of breath and wheezing.  Cardiovascular: Negative for chest pain, leg swelling, palpitations and syncope.  GI: Negative for abdominal pain, blood in stool, diarrhea, nausea and vomiting.  GU: Negative for difficulty urinating, dysuria, frequency and hematuria.  Musculoskeletal: Negative for back pain, leg pain and joint pain.  Skin: Negative for rash.  Neurological: Negative for dizziness, headaches, light-headedness, numbness and seizures.  Psychiatric: Negative for behavioral problem, confusion, depressed mood and sleep disturbance.       Objective:  Objective   Vitals:   04/19/21 1436  BP: 109/72  Weight: 170 lb 9.6 oz (77.4 kg)  Height: '5\' 4"'  (1.626 m)   Body mass index is 29.28 kg/m.  Physical Exam Vitals and nursing note reviewed. Exam conducted with a chaperone present.  Constitutional:      Appearance: Normal appearance. She is well-developed.  HENT:     Head: Normocephalic and atraumatic.  Eyes:     Extraocular Movements: Extraocular movements intact.      Pupils: Pupils are equal, round, and reactive to light.  Cardiovascular:     Rate and Rhythm: Normal rate and regular rhythm.  Pulmonary:     Effort: Pulmonary effort is normal. No respiratory distress.     Breath sounds: Normal breath sounds.  Abdominal:     General: Abdomen is flat.     Palpations: Abdomen is soft.  Genitourinary:    Comments: External: Normal appearing vulva. No lesions noted. Normal sensation with dull and sharp Speculum examination: Normal appearing cervix. No blood in the vaginal vault. No discharge.  Bimanual examination: Uterus midline, tender, normal in size, shape and contour. + CMT. No adnexal masses. + adnexal tenderness. Pelvis not fixed.  Breast exam: exam not performed Musculoskeletal:        General: No signs of injury.  Skin:    General: Skin is warm and dry.  Neurological:     Mental Status: She is alert and oriented to person, place, and time.  Psychiatric:        Behavior: Behavior normal.        Thought Content: Thought content normal.        Judgment: Judgment normal.    Assessment/Plan:     36 year old with acutely worsening abdominal/pelvic pain Given uterine and cervical motion tenderness will treat for PID.  Prescription for antibiotic sent encouraged to use Motrin as needed for pain control. Will send new swab to check for infection. Patient has previously had a normal pelvic ultrasound with suggestion of endocervical polyp however endocervical polyp would not cause this level of pain.  We will obtain abdominal pelvic CT for exclusion of other pathologies. Will consider laparoscopy for evaluation of endometriosis.  Will follow up with patient after CT hopefully in 2 weeks.  More than 20 minutes were spent face to face with the patient in the room, reviewing the medical record, labs and images, and coordinating care for the patient. The plan of management was discussed in detail and counseling was provided.    Adrian Prows  MD [facility] 04/19/2021 3:54 PM

## 2021-04-20 LAB — CBC WITH DIFFERENTIAL
Basophils Absolute: 0 10*3/uL (ref 0.0–0.2)
Basos: 1 %
EOS (ABSOLUTE): 0.1 10*3/uL (ref 0.0–0.4)
Eos: 1 %
Hematocrit: 38.3 % (ref 34.0–46.6)
Hemoglobin: 12.9 g/dL (ref 11.1–15.9)
Immature Grans (Abs): 0 10*3/uL (ref 0.0–0.1)
Immature Granulocytes: 0 %
Lymphocytes Absolute: 2.5 10*3/uL (ref 0.7–3.1)
Lymphs: 30 %
MCH: 30.5 pg (ref 26.6–33.0)
MCHC: 33.7 g/dL (ref 31.5–35.7)
MCV: 91 fL (ref 79–97)
Monocytes Absolute: 0.4 10*3/uL (ref 0.1–0.9)
Monocytes: 5 %
Neutrophils Absolute: 5.3 10*3/uL (ref 1.4–7.0)
Neutrophils: 63 %
RBC: 4.23 x10E6/uL (ref 3.77–5.28)
RDW: 11.9 % (ref 11.7–15.4)
WBC: 8.4 10*3/uL (ref 3.4–10.8)

## 2021-04-21 LAB — NUSWAB VAGINITIS PLUS (VG+)
Candida albicans, NAA: NEGATIVE
Candida glabrata, NAA: NEGATIVE
Chlamydia trachomatis, NAA: NEGATIVE
Neisseria gonorrhoeae, NAA: NEGATIVE
Trich vag by NAA: NEGATIVE

## 2021-04-23 ENCOUNTER — Encounter: Payer: Self-pay | Admitting: Obstetrics and Gynecology

## 2021-04-27 ENCOUNTER — Other Ambulatory Visit: Payer: Self-pay

## 2021-04-27 ENCOUNTER — Ambulatory Visit
Admission: EM | Admit: 2021-04-27 | Discharge: 2021-04-27 | Disposition: A | Discharge status: Home / Self Care | Payer: Self-pay | Attending: Physician Assistant | Admitting: Physician Assistant

## 2021-04-27 DIAGNOSIS — R21 Rash and other nonspecific skin eruption: Secondary | ICD-10-CM

## 2021-04-27 DIAGNOSIS — T7840XA Allergy, unspecified, initial encounter: Secondary | ICD-10-CM

## 2021-04-27 DIAGNOSIS — R220 Localized swelling, mass and lump, head: Secondary | ICD-10-CM

## 2021-04-27 MED ORDER — METHYLPREDNISOLONE SODIUM SUCC 125 MG IJ SOLR
125.0000 mg | Freq: Once | INTRAMUSCULAR | Status: AC
  Administered 2021-04-27: 125 mg via INTRAMUSCULAR

## 2021-04-27 MED ORDER — PREDNISONE 10 MG PO TABS: ORAL_TABLET | ORAL | 0 refills | Status: DC

## 2021-04-27 MED ORDER — FAMOTIDINE 40 MG PO TABS
40.0000 mg | ORAL_TABLET | Freq: Every day | ORAL | Status: DC
  Administered 2021-04-27: 40 mg via ORAL

## 2021-04-27 MED ORDER — EPINEPHRINE 0.3 MG/0.3ML IJ SOAJ: 0.3000 mg | INTRAMUSCULAR | 0 refills | Status: DC | PRN

## 2021-04-27 NOTE — ED Triage Notes (Signed)
Patient presents to Urgent Care with complaints of possible allergic reaction from doxy. She states she feels like tongue is swollen and rash noted on neck. Last dose of doxy 0800. She states she had doxy. Thursday night and Friday morning with no issues. She had benadryl 2x today. Last dose 30 mins ago.   Denies SOB.

## 2021-04-27 NOTE — ED Notes (Signed)
Provider at bedside

## 2021-04-27 NOTE — ED Provider Notes (Signed)
[facility]    CSN: [id number] Arrival date & time: 04/27/21  1218      History   Chief Complaint Chief Complaint  Patient presents with   Allergic Reaction    HPI Kelsey Bryant is a 36 y.o. female presenting for allergic reaction.  Patient says she started taking doxycycline this morning for suspected PID.  Patient says within about 2-3 hours she started to experience a rash on her chest and neck.  Patient says over the past 30 minutes she has started to develop numbness and tingling of her face and lips as well as sensation of throat fullness and tongue swelling.  Patient also feels her face looks swollen.  She is not having any difficulty swallowing or breathing.  No shortness of breath but she says her chest feels little tight.  Patient has taken doxycycline in the past and done okay with it.  She reports taking Benadryl twice a day with the last dose 30 minutes prior to arrival.  Patient reports similar reactions to cephalosporins and penicillins.  Says she was prescribed the doxycycline for possible PID but her testing came back negative so she is not sure if she needs to even take it.  No other complaints.  HPI  Past Medical History:  Diagnosis Date   Anxiety    BRCA negative 01/2021   MyRisk neg except AXIN2 and MSH2 VUS   Family history of breast cancer 01/2021   IBIS=12.8%.riskscore=9.8%   Insomnia    Ovarian cyst     Patient Active Problem List   Diagnosis Date Noted   Family history of breast cancer 01/04/2021    Past Surgical History:  Procedure Laterality Date   INTRAUTERINE DEVICE (IUD) INSERTION  2012    OB History     Gravida  2   Para  2   Term  2   Preterm      AB      Living  2      SAB      IAB      Ectopic      Multiple      Live Births  2            Home Medications    Prior to Admission medications   Medication Sig Start Date End Date Taking? Authorizing Provider  EPINEPHrine 0.3 mg/0.3 mL IJ SOAJ  injection Inject 0.3 mg into the muscle as needed for anaphylaxis (Inject into thigh muscle and call EMS). 04/27/21  Yes Danton Clap, PA-C  predniSONE (DELTASONE) 10 MG tablet Take 6 tabs PO x1 day and decrease by 1 tablet daily until complete 04/27/21  Yes Laurene Footman B, PA-C  albuterol (VENTOLIN HFA) 108 (90 Base) MCG/ACT inhaler Inhale 2 puffs into the lungs every 6 (six) hours as needed for wheezing or shortness of breath. 11/16/20   Juline Patch, MD  ALPRAZolam Duanne Moron) 0.25 MG tablet Take 1 tablet (0.25 mg total) by mouth 2 (two) times daily as needed for anxiety. 11/16/20   Juline Patch, MD  metroNIDAZOLE (FLAGYL) 500 MG tablet Take 1 tablet (500 mg total) by mouth 2 (two) times daily for 14 days. 04/19/21 05/03/21  Homero Fellers, MD  norgestimate-ethinyl estradiol (ORTHO-CYCLEN) 0.25-35 MG-MCG tablet TAKE 1 TABLET BY MOUTH EVERY DAY 3/87/56   Copland, Deirdre Evener, PA-C  phentermine (ADIPEX-P) 37.5 MG tablet Take 1 tablet (37.5 mg total) by mouth daily before breakfast. Patient not taking: Reported on 04/19/2021 03/08/21  Copland, Alicia B, PA-C  sertraline (ZOLOFT) 100 MG tablet Take 1 tablet (100 mg total) by mouth daily. 12/09/20   Juline Patch, MD  traZODone (DESYREL) 50 MG tablet TAKE 0.5-1 TABLETS BY MOUTH AT BEDTIME AS NEEDED FOR SLEEP. 02/05/21   Juline Patch, MD  valACYclovir (VALTREX) 1000 MG tablet Take 1 tablet (1,000 mg total) by mouth 2 (two) times daily. 05/08/20   Juline Patch, MD    Family History Family History  Problem Relation Age of Onset   Breast cancer Maternal Grandmother 81   Pancreatic cancer Maternal Uncle 60   Colon cancer Maternal Aunt 60    Social History Social History   Tobacco Use   Smoking status: Former   Smokeless tobacco: Never  Scientific laboratory technician Use: Never used  Substance Use Topics   Alcohol use: Yes    Alcohol/week: 0.0 standard drinks   Drug use: No     Allergies   Ceftin [cefuroxime], Doxycycline, and  Penicillins   Review of Systems Review of Systems  Constitutional:  Negative for fatigue.  HENT:  Positive for facial swelling. Negative for sore throat and trouble swallowing.   Respiratory:  Positive for chest tightness. Negative for shortness of breath and wheezing.   Cardiovascular:  Negative for chest pain.  Gastrointestinal:  Negative for nausea and vomiting.  Musculoskeletal:  Negative for myalgias.  Skin:  Positive for rash.  Neurological:  Negative for syncope and weakness.    Physical Exam Triage Vital Signs ED Triage Vitals  Enc Vitals Group     BP 04/27/21 1220 (!) 133/92     Pulse Rate 04/27/21 1220 85     Resp --      Temp 04/27/21 1220 98.7 F (37.1 C)     Temp Source 04/27/21 1220 Oral     SpO2 04/27/21 1220 100 %     Weight --      Height --      Head Circumference --      Peak Flow --      Pain Score 04/27/21 1226 0     Pain Loc --      Pain Edu? --      Excl. in [place]? --    No data found.  Updated Vital Signs BP (!) 133/92 (BP Location: Left Arm)    Pulse 85    Temp 98.7 F (37.1 C) (Oral)    LMP 04/12/2021    SpO2 100%   Physical Exam Vitals and nursing note reviewed.  Constitutional:      General: She is not in acute distress.    Appearance: Normal appearance. She is not ill-appearing or toxic-appearing.  HENT:     Head: Normocephalic and atraumatic.     Comments: Mild/moderate swelling bilateral cheeks    Nose: Nose normal.     Mouth/Throat:     Mouth: Mucous membranes are moist.     Pharynx: Oropharynx is clear.     Comments: Mild to moderate swelling posterior pharynx and uvula, mild swelling of tongue. Does not appear to have any lip swelling Eyes:     General: No scleral icterus.       Right eye: No discharge.        Left eye: No discharge.     Conjunctiva/sclera: Conjunctivae normal.  Cardiovascular:     Rate and Rhythm: Normal rate and regular rhythm.     Heart sounds: Normal heart sounds.  Pulmonary:     Effort: Pulmonary  effort is normal. No respiratory distress.     Breath sounds: Normal breath sounds. No wheezing, rhonchi or rales.  Musculoskeletal:     Cervical back: Neck supple.  Skin:    General: Skin is dry.     Findings: Rash (erythematous macular rash of chest and neck) present.  Neurological:     General: No focal deficit present.     Mental Status: She is alert. Mental status is at baseline.     Motor: No weakness.     Gait: Gait normal.  Psychiatric:        Mood and Affect: Mood normal.        Behavior: Behavior normal.        Thought Content: Thought content normal.     UC Treatments / Results  Labs (all labs ordered are listed, but only abnormal results are displayed) Labs Reviewed - No data to display  EKG   Radiology No results found.  Procedures Procedures (including critical care time)  Medications Ordered in UC Medications  famotidine (PEPCID) tablet 40 mg (40 mg Oral Given 04/27/21 1236)  methylPREDNISolone sodium succinate (SOLU-MEDROL) 125 mg/2 mL injection 125 mg (125 mg Intramuscular Given 04/27/21 1237)    Initial Impression / Assessment and Plan / UC Course  I have reviewed the triage vital signs and the nursing notes.  Pertinent labs & imaging results that were available during my care of the patient were reviewed by me and considered in my medical decision making (see chart for details).  36 year old female presenting for allergic reaction to doxycycline.  Patient has been experiencing a rash of her chest for the past 1.5 hours.  Has been experiencing lip numbness and tingling, intraoral swelling and facial swelling for the past 30 to 45 minutes.  Took Benadryl 30 minutes ago.  Vitals all normal stable.  Patient is not in any distress.  She does have mild to moderate swelling of her cheeks, posterior pharynx, uvula, tongue.  No lip swelling.  Chest clear to auscultation heart regular rate and rhythm.  Erythematous macular rash of chest.  Patient given 1.5 IM  Solu-Medrol in clinic as well as 40 mg oral famotidine at 12:30 PM.  Discussed with patient monitoring and giving epi if condition worsens versus going ahead and giving her epi at this time and call EMS.  She does not want to go to the ER.  Advise close monitoring.  Watch patient over the course of 2 hours.  She improved over the course of 2 hours and stated that she wanted to go home.  On exam her swelling completely went down and the rash went away.  Discussed allergic reactions with patient.  Sent EpiPen and discussed how to use it.  Advised starting prednisone taper tomorrow.  Advised continuing antihistamines every 4-6 hours over the next couple of days.  Thoroughly reviewed ED precautions with patient.  Final Clinical Impressions(s) / UC Diagnoses   Final diagnoses:  Allergic reaction, initial encounter  Facial swelling  Rash     Discharge Instructions      -You have had a serious allergic reaction to doxycycline.  You cannot take any thing related to this in the future. I am listing it as an allergy.  Continue with the Benadryl every 4-6 hours for the next couple days. - Start the prednisone taper tomorrow. - Let the prescriber know that you are serious reaction to this medication in case they want to take something else. - I have given you a  prescription for an EpiPen.  If your facial swelling starts to get worse or you have increased chest tightness or breathing trouble or lip swelling, you need to use this and then call EMS.     ED Prescriptions     Medication Sig Dispense Auth. Provider   predniSONE (DELTASONE) 10 MG tablet Take 6 tabs PO x1 day and decrease by 1 tablet daily until complete 21 tablet Laurene Footman B, PA-C   EPINEPHrine 0.3 mg/0.3 mL IJ SOAJ injection Inject 0.3 mg into the muscle as needed for anaphylaxis (Inject into thigh muscle and call EMS). 1 each Gretta Cool      PDMP not reviewed this encounter.   Danton Clap, PA-C 04/27/21 1501

## 2021-04-27 NOTE — Discharge Instructions (Signed)
-  You have had a serious allergic reaction to doxycycline.  You cannot take any thing related to this in the future. I am listing it as an allergy.  Continue with the Benadryl every 4-6 hours for the next couple days. - Start the prednisone taper tomorrow. - Let the prescriber know that you are serious reaction to this medication in case they want to take something else. - I have given you a prescription for an EpiPen.  If your facial swelling starts to get worse or you have increased chest tightness or breathing trouble or lip swelling, you need to use this and then call EMS.

## 2021-04-28 ENCOUNTER — Telehealth: Payer: Self-pay

## 2021-04-28 DIAGNOSIS — N730 Acute parametritis and pelvic cellulitis: Secondary | ICD-10-CM

## 2021-04-28 NOTE — Telephone Encounter (Signed)
Pls call pt to find out if she is still having pain. Then relay msg to Dr Jerene Pitch since she is managing this. Thx.

## 2021-04-28 NOTE — Telephone Encounter (Signed)
Pt calling to let us know she had an allergic reaction to doxycycline yesterday and is no longer taking it; was given a shot of prednisone in rearend and is currently on a prednisone taper.  [phone]

## 2021-04-30 MED ORDER — MOXIFLOXACIN HCL 400 MG PO TABS: 400.0000 mg | ORAL_TABLET | Freq: Every day | ORAL | 0 refills | Status: AC

## 2021-04-30 NOTE — Telephone Encounter (Signed)
Called pt; apologized for not getting back with her sooner; pt states she is still having abd pain and is waiting for a CT to be scheduled; she also stopped the flagyl as not sure if allergic reaction was the it or the doxy; pt requested msg be sent to [facility].

## 2021-04-30 NOTE — Telephone Encounter (Signed)
Spoke with pt about sx/abx. Pt still having pain but a little better. Was only able to do a few days of doxy/flagyl before having anaphylactic rxn. Stopped both abx since not sure which was cause of allergy. Will change to moxifloxacin 400 mg QD for 14 days, Rx eRxd. Mickel Fuchs, ref coordinator, is working on [employer] and appt. Pt understands.

## 2021-05-04 ENCOUNTER — Other Ambulatory Visit: Payer: Self-pay | Admitting: Family Medicine

## 2021-05-04 DIAGNOSIS — F5101 Primary insomnia: Secondary | ICD-10-CM

## 2021-05-04 NOTE — Telephone Encounter (Signed)
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

## 2021-05-19 ENCOUNTER — Ambulatory Visit
Admission: RE | Admit: 2021-05-19 | Discharge: 2021-05-19 | Disposition: A | Discharge status: Home / Self Care | Payer: 59 | Source: Ambulatory Visit | Attending: Obstetrics and Gynecology | Admitting: Obstetrics and Gynecology

## 2021-05-19 ENCOUNTER — Other Ambulatory Visit: Payer: Self-pay

## 2021-05-19 DIAGNOSIS — R109 Unspecified abdominal pain: Secondary | ICD-10-CM | POA: Diagnosis not present

## 2021-05-19 DIAGNOSIS — R1030 Lower abdominal pain, unspecified: Secondary | ICD-10-CM | POA: Diagnosis not present

## 2021-05-19 MED ORDER — IOHEXOL 300 MG/ML  SOLN
100.0000 mL | Freq: Once | INTRAMUSCULAR | Status: AC | PRN
  Administered 2021-05-19: 100 mL via INTRAVENOUS

## 2021-05-20 ENCOUNTER — Encounter: Payer: Self-pay | Admitting: Obstetrics and Gynecology

## 2021-05-25 ENCOUNTER — Other Ambulatory Visit: Payer: Self-pay | Admitting: Obstetrics and Gynecology

## 2021-05-25 DIAGNOSIS — N94819 Vulvodynia, unspecified: Secondary | ICD-10-CM

## 2021-05-25 MED ORDER — LIDOCAINE 3 % EX CREA: 1.0000 "application " | TOPICAL_CREAM | Freq: Two times a day (BID) | CUTANEOUS | 3 refills | Status: DC | PRN

## 2021-06-08 ENCOUNTER — Ambulatory Visit: Payer: 59 | Admitting: Family Medicine

## 2021-08-21 ENCOUNTER — Other Ambulatory Visit: Payer: Self-pay | Admitting: Family Medicine

## 2021-08-21 DIAGNOSIS — F5101 Primary insomnia: Secondary | ICD-10-CM

## 2021-08-23 NOTE — Telephone Encounter (Signed)
Courtesy refill ?Requested Prescriptions  ?Pending Prescriptions Disp Refills  ?? traZODone (DESYREL) 50 MG tablet [Pharmacy Med Name: TRAZODONE 50 MG TABLET] 30 tablet   ?  Sig: TAKE 1/2 TO 1 TABLET BY MOUTH AT BEDTIME AS NEEDED FOR SLEEP  ?  ? Psychiatry: Antidepressants - Serotonin Modulator Failed - 08/21/2021  1:47 AM  ?  ?  Failed - Valid encounter within last 6 months  ?  Recent Outpatient Visits   ?      ? 8 months ago Reactive depression  ? [facility] Duanne Limerick, MD  ? 9 months ago Acute maxillary sinusitis, recurrence not specified  ? [facility] Duanne Limerick, MD  ? 1 year ago Acute maxillary sinusitis, recurrence not specified  ? [facility] Duanne Limerick, MD  ? 1 year ago Anxiety  ? [facility] Duanne Limerick, MD  ? 2 years ago Chest pain due to GERD  ? [facility] Duanne Limerick, MD  ?  ?  ? ?  ?  ?  ? ? ?

## 2021-09-24 DIAGNOSIS — R1084 Generalized abdominal pain: Secondary | ICD-10-CM | POA: Diagnosis not present

## 2021-09-24 DIAGNOSIS — R1013 Epigastric pain: Secondary | ICD-10-CM | POA: Diagnosis not present

## 2021-09-24 DIAGNOSIS — R197 Diarrhea, unspecified: Secondary | ICD-10-CM | POA: Diagnosis not present

## 2021-10-21 ENCOUNTER — Other Ambulatory Visit: Payer: Self-pay

## 2021-10-21 DIAGNOSIS — N94819 Vulvodynia, unspecified: Secondary | ICD-10-CM

## 2021-10-21 MED ORDER — LIDOCAINE 3 % EX CREA: 1.0000 "application " | TOPICAL_CREAM | Freq: Two times a day (BID) | CUTANEOUS | 3 refills | Status: DC | PRN

## 2021-10-21 NOTE — Telephone Encounter (Signed)
[facility] pharmacy faxed refill request for Lidocaine 3% cream. Medication was last filled 05/25/21, last office visit was 04/19/21. Please advise if appropriate to refill medication?

## 2021-10-22 ENCOUNTER — Other Ambulatory Visit: Payer: Self-pay | Admitting: Obstetrics and Gynecology

## 2021-10-22 DIAGNOSIS — R634 Abnormal weight loss: Secondary | ICD-10-CM

## 2021-11-13 ENCOUNTER — Other Ambulatory Visit: Payer: Self-pay | Admitting: Family Medicine

## 2021-11-13 DIAGNOSIS — F329 Major depressive disorder, single episode, unspecified: Secondary | ICD-10-CM

## 2021-11-15 NOTE — Telephone Encounter (Signed)
Left voicemail to schedule yearly physical.   Given a 30 day courtesy supply of the Zoloft 100 mg tablet.

## 2021-11-23 ENCOUNTER — Ambulatory Visit (INDEPENDENT_AMBULATORY_CARE_PROVIDER_SITE_OTHER): Payer: 59 | Admitting: Family Medicine

## 2021-11-23 ENCOUNTER — Encounter: Payer: Self-pay | Admitting: Family Medicine

## 2021-11-23 VITALS — BP 124/84 | HR 76 | Ht 64.0 in | Wt 186.0 lb

## 2021-11-23 DIAGNOSIS — F329 Major depressive disorder, single episode, unspecified: Secondary | ICD-10-CM | POA: Diagnosis not present

## 2021-11-23 DIAGNOSIS — J0100 Acute maxillary sinusitis, unspecified: Secondary | ICD-10-CM

## 2021-11-23 DIAGNOSIS — F5101 Primary insomnia: Secondary | ICD-10-CM | POA: Diagnosis not present

## 2021-11-23 DIAGNOSIS — R69 Illness, unspecified: Secondary | ICD-10-CM | POA: Diagnosis not present

## 2021-11-23 DIAGNOSIS — B009 Herpesviral infection, unspecified: Secondary | ICD-10-CM | POA: Diagnosis not present

## 2021-11-23 MED ORDER — VALACYCLOVIR HCL 1 G PO TABS: 500.0000 mg | ORAL_TABLET | Freq: Two times a day (BID) | ORAL | 3 refills | Status: DC

## 2021-11-23 MED ORDER — AZITHROMYCIN 250 MG PO TABS
ORAL_TABLET | ORAL | 0 refills | Status: AC
Start: 2021-11-23 — End: 2021-11-28

## 2021-11-23 MED ORDER — SERTRALINE HCL 100 MG PO TABS: 100.0000 mg | ORAL_TABLET | Freq: Every day | ORAL | 1 refills | Status: DC

## 2021-11-23 MED ORDER — TRAZODONE HCL 50 MG PO TABS: 25.0000 mg | ORAL_TABLET | Freq: Every evening | ORAL | 0 refills | Status: DC | PRN

## 2021-11-23 NOTE — Progress Notes (Signed)
Date:  11/23/2021   Name:  Kelsey Bryant   DOB:  [birth year]   MRN:  [id number]   Chief Complaint: Insomnia, Depression, and fever blisters  Insomnia Primary symptoms: fragmented sleep, no difficulty falling asleep.   The current episode started more than one year. The onset quality is sudden. The problem has been gradually improving since onset. Past treatments include medication. The treatment provided moderate relief. PMH includes: no hypertension, depression.   Depression        This is a chronic problem.  The current episode started more than 1 year ago.   The problem occurs intermittently.  The problem has been gradually improving since onset.  Associated symptoms include fatigue, insomnia, restlessness and appetite change.  Associated symptoms include no decreased concentration, no helplessness, no hopelessness, not irritable, no decreased interest, no body aches, no myalgias, no headaches, no indigestion, not sad and no suicidal ideas.  Past treatments include SSRIs - Selective serotonin reuptake inhibitors.  Previous treatment provided moderate relief.   Lab Results  Component Value Date   NA 137 01/04/2021   K 4.5 01/04/2021   CO2 19 (L) 01/04/2021   GLUCOSE 80 01/04/2021   BUN 9 01/04/2021   CREATININE 0.76 01/04/2021   CALCIUM 8.7 01/04/2021   EGFR 105 01/04/2021   GFRNONAA >60 11/26/2015   No results found for: "CHOL", "HDL", "LDLCALC", "LDLDIRECT", "TRIG", "CHOLHDL" Lab Results  Component Value Date   TSH 1.440 01/04/2021   No results found for: "HGBA1C" Lab Results  Component Value Date   WBC 8.4 04/19/2021   HGB 12.9 04/19/2021   HCT 38.3 04/19/2021   MCV 91 04/19/2021   PLT 272 01/04/2021   Lab Results  Component Value Date   ALT 13 01/04/2021   AST 22 01/04/2021   ALKPHOS 58 01/04/2021   BILITOT 0.5 01/04/2021   No results found for: "25OHVITD2", "25OHVITD3", "VD25OH"   Review of Systems  Constitutional:  Positive for appetite change and  fatigue. Negative for chills and fever.  HENT:  Negative for drooling, ear discharge, ear pain and sore throat.   Respiratory:  Negative for cough, shortness of breath and wheezing.   Cardiovascular:  Negative for chest pain, palpitations and leg swelling.  Gastrointestinal:  Negative for abdominal pain, blood in stool, constipation, diarrhea and nausea.  Endocrine: Negative for polydipsia.  Genitourinary:  Negative for dysuria, frequency, hematuria and urgency.  Musculoskeletal:  Negative for back pain, myalgias and neck pain.  Skin:  Negative for rash.  Allergic/Immunologic: Negative for environmental allergies.  Neurological:  Negative for dizziness and headaches.  Hematological:  Does not bruise/bleed easily.  Psychiatric/Behavioral:  Positive for depression. Negative for decreased concentration and suicidal ideas. The patient has insomnia. The patient is not nervous/anxious.     Patient Active Problem List   Diagnosis Date Noted   Family history of breast cancer 01/04/2021    Allergies  Allergen Reactions   Ceftin [Cefuroxime] Anaphylaxis   Doxycycline Swelling and Rash   Penicillins Anaphylaxis    Past Surgical History:  Procedure Laterality Date   INTRAUTERINE DEVICE (IUD) INSERTION  2012    Social History   Tobacco Use   Smoking status: Former   Smokeless tobacco: Never  Vaping Use   Vaping Use: Never used  Substance Use Topics   Alcohol use: Yes    Alcohol/week: 0.0 standard drinks of alcohol   Drug use: No     Medication list has been reviewed and updated.  Current Meds  Medication Sig   albuterol (VENTOLIN HFA) 108 (90 Base) MCG/ACT inhaler Inhale 2 puffs into the lungs every 6 (six) hours as needed for wheezing or shortness of breath.   ALPRAZolam (XANAX) 0.25 MG tablet Take 1 tablet (0.25 mg total) by mouth 2 (two) times daily as needed for anxiety.   EPINEPHrine 0.3 mg/0.3 mL IJ SOAJ injection Inject 0.3 mg into the muscle as needed for anaphylaxis  (Inject into thigh muscle and call EMS).   Lidocaine 3 % CREA Apply 1 application  topically 2 (two) times daily as needed.   norgestimate-ethinyl estradiol (ORTHO-CYCLEN) 0.25-35 MG-MCG tablet TAKE 1 TABLET BY MOUTH EVERY DAY   sertraline (ZOLOFT) 100 MG tablet TAKE 1 TABLET BY MOUTH EVERY DAY   traZODone (DESYREL) 50 MG tablet TAKE 1/2 TO 1 TABLET BY MOUTH AT BEDTIME AS NEEDED FOR SLEEP   valACYclovir (VALTREX) 1000 MG tablet Take 1 tablet (1,000 mg total) by mouth 2 (two) times daily.       11/23/2021   11:34 AM 12/09/2020    8:05 AM 11/16/2020   11:11 AM 04/24/2020    1:53 PM  GAD 7 : Generalized Anxiety Score  Nervous, Anxious, on Edge 1 0 0 0  Control/stop worrying 0 0 2 3  Worry too much - different things 0 '1 2 2  ' Trouble relaxing 0 0 3 1  Restless 1 0 3 2  Easily annoyed or irritable '1 1 3 3  ' Afraid - awful might happen 0 0 0 0  Total GAD 7 Score '3 2 13 11  ' Anxiety Difficulty Not difficult at all Not difficult at all Not difficult at all Very difficult       11/23/2021   11:33 AM 12/09/2020    8:04 AM 11/16/2020   11:11 AM  Depression screen PHQ 2/9  Decreased Interest 0 0 0  Down, Depressed, Hopeless 0 0 0  PHQ - 2 Score 0 0 0  Altered sleeping '1 1 1  ' Tired, decreased energy '1 2 1  ' Change in appetite 1 0 0  Feeling bad or failure about yourself  0 0 0  Trouble concentrating 0 0 0  Moving slowly or fidgety/restless 1 1 0  Suicidal thoughts 0 0 0  PHQ-9 Score '4 4 2  ' Difficult doing work/chores Not difficult at all Not difficult at all Not difficult at all    BP Readings from Last 3 Encounters:  04/27/21 (!) 133/92  04/19/21 109/72  01/04/21 104/70    Physical Exam Vitals and nursing note reviewed.  Constitutional:      General: She is not irritable.    Appearance: She is well-developed.  HENT:     Head: Normocephalic.     Right Ear: Hearing, ear canal and external ear normal. Tympanic membrane is retracted.     Left Ear: Hearing, ear canal and external ear  normal. Tympanic membrane is retracted.     Nose:     Right Turbinates: Not swollen.     Left Turbinates: Not swollen.     Right Sinus: Maxillary sinus tenderness present. No frontal sinus tenderness.     Left Sinus: Maxillary sinus tenderness present. No frontal sinus tenderness.     Mouth/Throat:     Lips: Pink.     Mouth: Mucous membranes are moist.     Dentition: No dental tenderness.     Tongue: No lesions.     Palate: No mass.     Pharynx: Oropharynx is clear.  Eyes:  General: Lids are everted, no foreign bodies appreciated. No scleral icterus.       Left eye: No foreign body or hordeolum.     Conjunctiva/sclera: Conjunctivae normal.     Right eye: Right conjunctiva is not injected.     Left eye: Left conjunctiva is not injected.     Pupils: Pupils are equal, round, and reactive to light.  Neck:     Thyroid: No thyromegaly.     Vascular: No JVD.     Trachea: No tracheal deviation.  Cardiovascular:     Rate and Rhythm: Normal rate and regular rhythm.     Heart sounds: Normal heart sounds. No murmur heard.    No friction rub. No gallop.  Pulmonary:     Effort: Pulmonary effort is normal. No respiratory distress.     Breath sounds: Normal breath sounds. No wheezing or rales.  Abdominal:     General: Bowel sounds are normal.     Palpations: Abdomen is soft. There is no mass.     Tenderness: There is no abdominal tenderness. There is no guarding or rebound.  Musculoskeletal:        General: No tenderness. Normal range of motion.     Cervical back: Normal range of motion and neck supple.  Lymphadenopathy:     Cervical: No cervical adenopathy.  Skin:    General: Skin is warm.     Findings: No rash.  Neurological:     Mental Status: She is alert and oriented to person, place, and time.     Cranial Nerves: No cranial nerve deficit.     Deep Tendon Reflexes: Reflexes normal.  Psychiatric:        Mood and Affect: Mood is not anxious or depressed.     Wt Readings from  Last 3 Encounters:  11/23/21 186 lb (84.4 kg)  04/19/21 170 lb 9.6 oz (77.4 kg)  01/04/21 197 lb (89.4 kg)    Pulse 76   Ht '5\' 4"'  (1.626 m)   Wt 186 lb (84.4 kg)   LMP 11/09/2021 (Approximate)   BMI 31.93 kg/m   Assessment and Plan:  1. Acute maxillary sinusitis, recurrence not specified Acute.  Persistent.  Patient returns from a trip to he has sinus discomfort and there is tenderness over the maxillary sinuses bilateral.  Consistent with an early sinusitis We will treat with azithromycin to 50 mg 2 today followed by 1 a day for 4 days. - azithromycin (ZITHROMAX) 250 MG tablet; Take 2 tablets on day 1, then 1 tablet daily on days 2 through 5  Dispense: 6 tablet; Refill: 0  2. Herpes simplex Chronic.  Episodic.  Stable.  Refilled Valtrex 1000 mg 1/2 tablet twice daily as needed. - valACYclovir (VALTREX) 1000 MG tablet; Take 0.5 tablets (500 mg total) by mouth 2 (two) times daily.  Dispense: 8 tablet; Refill: 3  3. Reactive depression Chronic.  Controlled.  Stable.  PHQ is 4 Gad score 3.  Continue sertraline 100 we will recheck in 6 months - sertraline (ZOLOFT) 100 MG tablet; Take 1 tablet (100 mg total) by mouth daily.  Dispense: 90 tablet; Refill: 1  4. Primary insomnia Chronic.  Controlled.  Stable.  Patient does well with trazodone particularly 50 mg.  This makes her groggy in the morning and she is compliant.  0.5 tablet  25 mg..  We will refill at current dosing and patient will use as desired. - traZODone (DESYREL) 50 MG tablet; Take 0.5-1 tablets (25-50 mg total) by  mouth at bedtime as needed. for sleep  Dispense: 90 tablet; Refill: 0    Otilio Miu, MD

## 2021-12-12 ENCOUNTER — Other Ambulatory Visit: Payer: Self-pay | Admitting: Obstetrics and Gynecology

## 2021-12-12 DIAGNOSIS — Z3041 Encounter for surveillance of contraceptive pills: Secondary | ICD-10-CM

## 2021-12-22 ENCOUNTER — Other Ambulatory Visit: Payer: Self-pay | Admitting: Obstetrics and Gynecology

## 2021-12-22 ENCOUNTER — Other Ambulatory Visit: Payer: Self-pay

## 2021-12-22 DIAGNOSIS — Z3041 Encounter for surveillance of contraceptive pills: Secondary | ICD-10-CM

## 2021-12-22 MED ORDER — NORGESTIMATE-ETH ESTRADIOL 0.25-35 MG-MCG PO TABS: ORAL_TABLET | ORAL | 0 refills | Status: DC

## 2021-12-24 ENCOUNTER — Ambulatory Visit
Admission: RE | Admit: 2021-12-24 | Discharge: 2021-12-24 | Disposition: A | Discharge status: Home / Self Care | Payer: 59 | Attending: Gastroenterology | Admitting: Gastroenterology

## 2021-12-24 ENCOUNTER — Encounter
Admission: RE | Disposition: A | Discharge status: Home / Self Care | Payer: Self-pay | Source: Home / Self Care | Attending: Gastroenterology

## 2021-12-24 ENCOUNTER — Ambulatory Visit: Payer: 59 | Admitting: Anesthesiology

## 2021-12-24 DIAGNOSIS — K5730 Diverticulosis of large intestine without perforation or abscess without bleeding: Secondary | ICD-10-CM | POA: Diagnosis not present

## 2021-12-24 DIAGNOSIS — J45909 Unspecified asthma, uncomplicated: Secondary | ICD-10-CM | POA: Diagnosis not present

## 2021-12-24 DIAGNOSIS — Z87891 Personal history of nicotine dependence: Secondary | ICD-10-CM | POA: Diagnosis not present

## 2021-12-24 DIAGNOSIS — R69 Illness, unspecified: Secondary | ICD-10-CM | POA: Diagnosis not present

## 2021-12-24 DIAGNOSIS — K529 Noninfective gastroenteritis and colitis, unspecified: Secondary | ICD-10-CM | POA: Insufficient documentation

## 2021-12-24 DIAGNOSIS — R197 Diarrhea, unspecified: Secondary | ICD-10-CM | POA: Diagnosis not present

## 2021-12-24 DIAGNOSIS — R1084 Generalized abdominal pain: Secondary | ICD-10-CM | POA: Insufficient documentation

## 2021-12-24 HISTORY — PX: ESOPHAGOGASTRODUODENOSCOPY (EGD) WITH PROPOFOL: SHX5813

## 2021-12-24 HISTORY — PX: COLONOSCOPY WITH PROPOFOL: SHX5780

## 2021-12-24 LAB — HM COLONOSCOPY

## 2021-12-24 LAB — POCT PREGNANCY, URINE: Preg Test, Ur: NEGATIVE

## 2021-12-24 SURGERY — COLONOSCOPY WITH PROPOFOL
Anesthesia: General

## 2021-12-24 MED ORDER — STERILE WATER FOR IRRIGATION IR SOLN
Status: DC | PRN
  Administered 2021-12-24: 60 mL

## 2021-12-24 MED ORDER — PROPOFOL 10 MG/ML IV BOLUS
INTRAVENOUS | Status: DC | PRN
  Administered 2021-12-24: 100 mg via INTRAVENOUS
  Administered 2021-12-24: 20 mg via INTRAVENOUS

## 2021-12-24 MED ORDER — PROPOFOL 500 MG/50ML IV EMUL
INTRAVENOUS | Status: DC | PRN
  Administered 2021-12-24: 165 ug/kg/min via INTRAVENOUS

## 2021-12-24 MED ORDER — SODIUM CHLORIDE 0.9 % IV SOLN
INTRAVENOUS | Status: DC
  Administered 2021-12-24: 20 mL/h via INTRAVENOUS

## 2021-12-24 MED ORDER — LIDOCAINE HCL (CARDIAC) PF 100 MG/5ML IV SOSY
PREFILLED_SYRINGE | INTRAVENOUS | Status: DC | PRN
  Administered 2021-12-24: 100 mg via INTRAVENOUS

## 2021-12-24 NOTE — Op Note (Signed)
[facility] Patient Name: Kelsey Bryant Procedure Date: 12/24/2021 12:35 PM MRN: [id number] Account #: [id number] Date of Birth: [birth year] Admit Type: Outpatient Age: 36 Room: [facility] ENDO ROOM 3 Gender: Female Note Status: Finalized Instrument Name: Upper Endoscope [id number] Procedure:             Upper GI endoscopy Indications:           Generalized abdominal pain Providers:             Andrey Farmer MD, MD Referring MD:          Juline Patch, MD (Referring MD) Medicines:             Monitored Anesthesia Care Complications:         No immediate complications. Estimated blood loss:                         Minimal. Procedure:             Pre-Anesthesia Assessment:                        - Prior to the procedure, a History and Physical was                         performed, and patient medications and allergies were                         reviewed. The patient is competent. The risks and                         benefits of the procedure and the sedation options and                         risks were discussed with the patient. All questions                         were answered and informed consent was obtained.                         Patient identification and proposed procedure were                         verified by the physician, the nurse, the                         anesthesiologist, the anesthetist and the technician                         in the endoscopy suite. Mental Status Examination:                         alert and oriented. Airway Examination: normal                         oropharyngeal airway and neck mobility. Respiratory                         Examination: clear to auscultation. CV Examination:  normal. Prophylactic Antibiotics: The patient does not                         require prophylactic antibiotics. Prior                         Anticoagulants: The patient has taken no previous                          anticoagulant or antiplatelet agents. ASA Grade                         Assessment: II - A patient with mild systemic disease.                         After reviewing the risks and benefits, the patient                         was deemed in satisfactory condition to undergo the                         procedure. The anesthesia plan was to use monitored                         anesthesia care (MAC). Immediately prior to                         administration of medications, the patient was                         re-assessed for adequacy to receive sedatives. The                         heart rate, respiratory rate, oxygen saturations,                         blood pressure, adequacy of pulmonary ventilation, and                         response to care were monitored throughout the                         procedure. The physical status of the patient was                         re-assessed after the procedure.                        After obtaining informed consent, the endoscope was                         passed under direct vision. Throughout the procedure,                         the patient's blood pressure, pulse, and oxygen                         saturations were monitored continuously. The Endoscope  was introduced through the mouth, and advanced to the                         second part of duodenum. The upper GI endoscopy was                         accomplished without difficulty. The patient tolerated                         the procedure well. Findings:      The examined esophagus was normal.      The entire examined stomach was normal. Biopsies were taken with a cold       forceps for Helicobacter pylori testing. Estimated blood loss was       minimal.      The examined duodenum was normal. Impression:            - Normal esophagus.                        - Normal stomach. Biopsied.                        - Normal examined  duodenum. Recommendation:        - Await pathology results.                        - Perform a colonoscopy today. Procedure Code(s):     --- Professional ---                        [phone], Esophagogastroduodenoscopy, flexible,                         transoral; with biopsy, single or multiple Diagnosis Code(s):     --- Professional ---                        R10.84, Generalized abdominal pain CPT copyright 2019 [employer] Medical Association. All rights reserved. The codes documented in this report are preliminary and upon coder review may  be revised to meet current compliance requirements. Andrey Farmer MD, MD 12/24/2021 1:04:01 PM Number of Addenda: 0 Note Initiated On: 12/24/2021 12:35 PM Estimated Blood Loss:  Estimated blood loss was minimal.      [facility]

## 2021-12-24 NOTE — Transfer of Care (Signed)
Immediate Anesthesia Transfer of Care Note  Patient: Kelsey Bryant  Procedure(s) Performed: COLONOSCOPY WITH PROPOFOL ESOPHAGOGASTRODUODENOSCOPY (EGD) WITH PROPOFOL  Patient Location: Endoscopy Unit  Anesthesia Type:General  Level of Consciousness: awake  Airway & Oxygen Therapy: Patient Spontanous Breathing  Post-op Assessment: Report given to RN and Post -op Vital signs reviewed and stable  Post vital signs: Reviewed and stable  Last Vitals:  Vitals Value Taken Time  BP 87/48 12/24/21 1306  Temp 36.9 C 12/24/21 1305  Pulse 71 12/24/21 1307  Resp 18 12/24/21 1307  SpO2 99 % 12/24/21 1307    Last Pain:  Vitals:   12/24/21 1305  TempSrc: Temporal  PainSc: 0-No pain         Complications: No notable events documented.

## 2021-12-24 NOTE — Interval H&P Note (Signed)
History and Physical Interval Note:  12/24/2021 12:27 PM  Kelsey Bryant  has presented today for surgery, with the diagnosis of GENERALIZED ABDOMINAL PAIN,DYSPEPSIA,DIARRHEA.  The various methods of treatment have been discussed with the patient and family. After consideration of risks, benefits and other options for treatment, the patient has consented to  Procedure(s): COLONOSCOPY WITH PROPOFOL (N/A) ESOPHAGOGASTRODUODENOSCOPY (EGD) WITH PROPOFOL (N/A) as a surgical intervention.  The patient's history has been reviewed, patient examined, no change in status, stable for surgery.  I have reviewed the patient's chart and labs.  Questions were answered to the patient's satisfaction.     Regis Bill  Ok to proceed with EGD/Colonoscopy

## 2021-12-24 NOTE — Anesthesia Preprocedure Evaluation (Signed)
Anesthesia Evaluation  Patient identified by MRN, date of birth, ID band Patient awake    Reviewed: Allergy & Precautions, NPO status , Patient's Chart, lab work & pertinent test results  History of Anesthesia Complications Negative for: history of anesthetic complications  Airway Mallampati: II  TM Distance: >3 FB Neck ROM: Full    Dental no notable dental hx.    Pulmonary asthma , Current Smoker and Patient abstained from smoking., former smoker,    Pulmonary exam normal breath sounds clear to auscultation       Cardiovascular negative cardio ROS Normal cardiovascular exam Rhythm:Regular Rate:Normal     Neuro/Psych Anxiety negative neurological ROS  negative psych ROS   GI/Hepatic negative GI ROS, Neg liver ROS,   Endo/Other  negative endocrine ROS  Renal/GU negative Renal ROS  negative genitourinary   Musculoskeletal negative musculoskeletal ROS (+)   Abdominal   Peds negative pediatric ROS (+)  Hematology negative hematology ROS (+)   Anesthesia Other Findings   Reproductive/Obstetrics negative OB ROS                             Anesthesia Physical Anesthesia Plan  ASA: 2  Anesthesia Plan: General   Post-op Pain Management: Minimal or no pain anticipated   Induction: Intravenous  PONV Risk Score and Plan: 2 and TIVA and Propofol infusion  Airway Management Planned: Natural Airway and Nasal Cannula  Additional Equipment:   Intra-op Plan:   Post-operative Plan:   Informed Consent: I have reviewed the patients History and Physical, chart, labs and discussed the procedure including the risks, benefits and alternatives for the proposed anesthesia with the patient or authorized representative who has indicated his/her understanding and acceptance.     Dental Advisory Given  Plan Discussed with: Anesthesiologist, CRNA and Surgeon  Anesthesia Plan Comments: (Patient  consented for risks of anesthesia including but not limited to:  - adverse reactions to medications - risk of airway placement if required - damage to eyes, teeth, lips or other oral mucosa - nerve damage due to positioning  - sore throat or hoarseness - Damage to heart, brain, nerves, lungs, other parts of body or loss of life  Patient voiced understanding.)        Anesthesia Quick Evaluation

## 2021-12-24 NOTE — H&P (Signed)
Outpatient short stay form Pre-procedure 12/24/2021  Lesly Rubenstein, MD  Primary Physician: Juline Patch, MD  Reason for visit:  Abdominal pain/Loose stools  History of present illness:    36 y/o lady with history of anxiety here for EGD/Colonoscopy for abdominal pain and loose stools. No family history of GI malignancies. No blood thinners. No significant abdominal surgeries.    Current Facility-Administered Medications:    0.9 %  sodium chloride infusion, , Intravenous, Continuous, Cystal Shannahan, Hilton Cork, MD, Last Rate: 20 mL/hr at 12/24/21 1223, 20 mL/hr at 12/24/21 1223  Medications Prior to Admission  Medication Sig Dispense Refill Last Dose   albuterol (VENTOLIN HFA) 108 (90 Base) MCG/ACT inhaler Inhale 2 puffs into the lungs every 6 (six) hours as needed for wheezing or shortness of breath. 8 g 2 Past Week   ALPRAZolam (XANAX) 0.25 MG tablet Take 1 tablet (0.25 mg total) by mouth 2 (two) times daily as needed for anxiety. 30 tablet 0 Past Week   Lidocaine 3 % CREA Apply 1 application  topically 2 (two) times daily as needed. 30 g 3 Past Week   norgestimate-ethinyl estradiol (ORTHO-CYCLEN) 0.25-35 MG-MCG tablet TAKE 1 TABLET BY MOUTH EVERY DAY 84 tablet 0 Past Week   sertraline (ZOLOFT) 100 MG tablet Take 1 tablet (100 mg total) by mouth daily. 90 tablet 1 12/23/2021   traZODone (DESYREL) 50 MG tablet Take 0.5-1 tablets (25-50 mg total) by mouth at bedtime as needed. for sleep 90 tablet 0 Past Week   valACYclovir (VALTREX) 1000 MG tablet Take 0.5 tablets (500 mg total) by mouth 2 (two) times daily. 8 tablet 3 Past Week   EPINEPHrine 0.3 mg/0.3 mL IJ SOAJ injection Inject 0.3 mg into the muscle as needed for anaphylaxis (Inject into thigh muscle and call EMS). 1 each 0    phentermine (ADIPEX-P) 37.5 MG tablet Take 1 tablet (37.5 mg total) by mouth daily before breakfast. (Patient not taking: Reported on 04/19/2021) 30 tablet 0      Allergies  Allergen Reactions   Ceftin  [Cefuroxime] Anaphylaxis   Doxycycline Swelling and Rash   Penicillins Anaphylaxis     Past Medical History:  Diagnosis Date   Anxiety    BRCA negative 01/2021   MyRisk neg except AXIN2 and MSH2 VUS   Family history of breast cancer 01/2021   IBIS=12.8%.riskscore=9.8%   Insomnia    Ovarian cyst     Review of systems:  Otherwise negative.    Physical Exam  Gen: Alert, oriented. Appears stated age.  HEENT: PERRLA. Lungs: No respiratory distress CV: RRR Abd: soft, benign, no masses Ext: No edema    Planned procedures: Proceed with EGD/colonoscopy. The patient understands the nature of the planned procedure, indications, risks, alternatives and potential complications including but not limited to bleeding, infection, perforation, damage to internal organs and possible oversedation/side effects from anesthesia. The patient agrees and gives consent to proceed.  Please refer to procedure notes for findings, recommendations and patient disposition/instructions.     Lesly Rubenstein, MD [facility]

## 2021-12-24 NOTE — Anesthesia Procedure Notes (Signed)
Date/Time: 12/24/2021 12:40 PM  Performed by: Tammi Klippel, CRNAPre-anesthesia Checklist: Patient identified, Suction available, Patient being monitored, Timeout performed and Emergency Drugs available Oxygen Delivery Method: Simple face mask

## 2021-12-24 NOTE — Op Note (Signed)
[facility] Patient Name: Kelsey Bryant Procedure Date: 12/24/2021 12:34 PM MRN: [id number] Account #: [id number] Date of Birth: 1985-09-06 Admit Type: Outpatient Age: 36 Room: [facility] ENDO ROOM 3 Gender: Female Note Status: Finalized Instrument Name: Jasper Riling [id number] Procedure:             Colonoscopy Indications:           Generalized abdominal pain, Chronic diarrhea Providers:             Andrey Farmer MD, MD Referring MD:          Juline Patch, MD (Referring MD) Medicines:             Monitored Anesthesia Care Complications:         No immediate complications. Estimated blood loss:                         Minimal. Procedure:             Pre-Anesthesia Assessment:                        - Prior to the procedure, a History and Physical was                         performed, and patient medications and allergies were                         reviewed. The patient is competent. The risks and                         benefits of the procedure and the sedation options and                         risks were discussed with the patient. All questions                         were answered and informed consent was obtained.                         Patient identification and proposed procedure were                         verified by the physician, the nurse, the                         anesthesiologist, the anesthetist and the technician                         in the endoscopy suite. Mental Status Examination:                         alert and oriented. Airway Examination: normal                         oropharyngeal airway and neck mobility. Respiratory                         Examination: clear to auscultation. CV Examination:  normal. Prophylactic Antibiotics: The patient does not                         require prophylactic antibiotics. Prior                         Anticoagulants: The patient has taken no previous                          anticoagulant or antiplatelet agents. ASA Grade                         Assessment: II - A patient with mild systemic disease.                         After reviewing the risks and benefits, the patient                         was deemed in satisfactory condition to undergo the                         procedure. The anesthesia plan was to use monitored                         anesthesia care (MAC). Immediately prior to                         administration of medications, the patient was                         re-assessed for adequacy to receive sedatives. The                         heart rate, respiratory rate, oxygen saturations,                         blood pressure, adequacy of pulmonary ventilation, and                         response to care were monitored throughout the                         procedure. The physical status of the patient was                         re-assessed after the procedure.                        After obtaining informed consent, the colonoscope was                         passed under direct vision. Throughout the procedure,                         the patient's blood pressure, pulse, and oxygen                         saturations were monitored continuously. The  Colonoscope was introduced through the anus and                         advanced to the the terminal ileum. The colonoscopy                         was performed without difficulty. The patient                         tolerated the procedure well. The quality of the bowel                         preparation was good. Findings:      The perianal and digital rectal examinations were normal.      The terminal ileum appeared normal.      Multiple small and large-mouthed diverticula were found in the       descending colon, splenic flexure, transverse colon and ascending colon.      Normal mucosa was found in the entire colon. Biopsies for histology were        taken with a cold forceps from the entire colon for evaluation of       microscopic colitis. Estimated blood loss was minimal.      The exam was otherwise without abnormality on direct and retroflexion       views. Impression:            - The examined portion of the ileum was normal.                        - Diverticulosis in the descending colon, at the                         splenic flexure, in the transverse colon and in the                         ascending colon.                        - Normal mucosa in the entire examined colon. Biopsied.                        - The examination was otherwise normal on direct and                         retroflexion views. Recommendation:        - Discharge patient to home.                        - Resume previous diet.                        - Continue present medications.                        - Await pathology results.                        - Repeat colonoscopy in 10 years for screening  purposes.                        - Return to referring physician as previously                         scheduled. Procedure Code(s):     --- Professional ---                        [phone], Colonoscopy, flexible; with biopsy, single or                         multiple Diagnosis Code(s):     --- Professional ---                        R10.84, Generalized abdominal pain                        K52.9, Noninfective gastroenteritis and colitis,                         unspecified                        K57.30, Diverticulosis of large intestine without                         perforation or abscess without bleeding CPT copyright 2019 American Medical Association. All rights reserved. The codes documented in this report are preliminary and upon coder review may  be revised to meet current compliance requirements. Andrey Farmer MD, MD 12/24/2021 1:07:28 PM Number of Addenda: 0 Note Initiated On: 12/24/2021 12:34 PM Scope Withdrawal  Time: 0 hours 7 minutes 24 seconds  Total Procedure Duration: 0 hours 11 minutes 10 seconds  Estimated Blood Loss:  Estimated blood loss was minimal.      [facility]

## 2021-12-27 ENCOUNTER — Encounter: Payer: Self-pay | Admitting: Gastroenterology

## 2021-12-27 LAB — SURGICAL PATHOLOGY

## 2021-12-27 NOTE — Anesthesia Postprocedure Evaluation (Signed)
Anesthesia Post Note  Patient: Kelsey Bryant  Procedure(s) Performed: COLONOSCOPY WITH PROPOFOL ESOPHAGOGASTRODUODENOSCOPY (EGD) WITH PROPOFOL  Patient location during evaluation: Endoscopy Anesthesia Type: General Level of consciousness: awake and alert Pain management: pain level controlled Vital Signs Assessment: post-procedure vital signs reviewed and stable Respiratory status: spontaneous breathing, nonlabored ventilation, respiratory function stable and patient connected to nasal cannula oxygen Cardiovascular status: blood pressure returned to baseline and stable Postop Assessment: no apparent nausea or vomiting Anesthetic complications: no   No notable events documented.   Last Vitals:  Vitals:   12/24/21 1315 12/24/21 1325  BP: 105/68 115/81  Pulse: 65 64  Resp: 13 14  Temp:    SpO2: 100% 100%    Last Pain:  Vitals:   12/24/21 1325  TempSrc:   PainSc: 0-No pain                 Ilene Qua

## 2022-01-28 ENCOUNTER — Telehealth: Payer: Self-pay

## 2022-01-28 DIAGNOSIS — Z3041 Encounter for surveillance of contraceptive pills: Secondary | ICD-10-CM

## 2022-01-28 MED ORDER — NORGESTIMATE-ETH ESTRADIOL 0.25-35 MG-MCG PO TABS: ORAL_TABLET | ORAL | 0 refills | Status: DC

## 2022-01-28 NOTE — Telephone Encounter (Signed)
Pt left msg on triage saying she had to r/s her annual and she needs a RF on her BC to get her to her appt. BC RF sent, called pt, no answer, left detailed msg RF sent to pharmacy.

## 2022-02-01 ENCOUNTER — Ambulatory Visit: Payer: 59 | Admitting: Obstetrics and Gynecology

## 2022-02-27 ENCOUNTER — Other Ambulatory Visit: Payer: Self-pay | Admitting: Family Medicine

## 2022-02-27 DIAGNOSIS — F5101 Primary insomnia: Secondary | ICD-10-CM

## 2022-02-27 DIAGNOSIS — F329 Major depressive disorder, single episode, unspecified: Secondary | ICD-10-CM

## 2022-02-28 DIAGNOSIS — M79645 Pain in left finger(s): Secondary | ICD-10-CM | POA: Diagnosis not present

## 2022-02-28 DIAGNOSIS — M65842 Other synovitis and tenosynovitis, left hand: Secondary | ICD-10-CM | POA: Diagnosis not present

## 2022-03-20 NOTE — Progress Notes (Unsigned)
PCP:  Juline Patch, MD   No chief complaint on file.    HPI:      [name] is a 36 y.o. [phone] whose LMP was No LMP recorded., presents today for her annual examination.  Her menses are regular every 28-30 days, lasting 4-5 days on OCPs.  Dysmenorrhea mild, occurring first 1-2 days of flow. She does not usually have intermenstrual bleeding but has this past pill pack without late/missed pills. Pt also with drenching night sweats several times a week past few months. Has been under significant amount of stress. Now on anxiety med and trazodone for sleep.   Sex activity: single partner, contraception - OCP (estrogen/progesterone). Has dyspareunia for past yr, occas PC spotting for a few hrs. No vag dryness. Hx of ovar cysts. Sx worse last yr with neg GYN u/s and neg abd/pelvic CT except diverticulosis. 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 Last Pap: 06/06/18 Results were: no abnormalities /neg HPV DNA  Hx of STDs: none  There is a FH of breast cancer in her MGM, pancreatic cancer in her mat uncle and colon cancer in her mat aunt, genetic testing not done. There is no FH of ovarian cancer. The patient does not do self-breast exams. IBIS=12.8%/riskscore=9.8%   Colonoscopy: 9/23 with [facility]; hx of diverticulosis/pelvic pain.  Tobacco use: quit cigs, occas vapes Alcohol use: none No drug use.  Exercise: moderately active  She does not get adequate calcium and Vitamin D in her diet. Would like Rx for phentermine for wt loss before her daughter's wedding. Has worked in the past.   Past Medical History:  Diagnosis Date   Anxiety    BRCA negative 01/2021   MyRisk neg except AXIN2 and MSH2 VUS   Family history of breast cancer 01/2021   IBIS=12.8%.riskscore=9.8%   Insomnia    Ovarian cyst     Past Surgical History:  Procedure Laterality Date   COLONOSCOPY WITH PROPOFOL N/A 12/24/2021   Procedure: COLONOSCOPY WITH PROPOFOL;  Surgeon:  Lesly Rubenstein, MD;  Location: [facility] ENDOSCOPY;  Service: Endoscopy;  Laterality: N/A;   ESOPHAGOGASTRODUODENOSCOPY (EGD) WITH PROPOFOL N/A 12/24/2021   Procedure: ESOPHAGOGASTRODUODENOSCOPY (EGD) WITH PROPOFOL;  Surgeon: Lesly Rubenstein, MD;  Location: [facility] ENDOSCOPY;  Service: Endoscopy;  Laterality: N/A;   INTRAUTERINE DEVICE (IUD) INSERTION  2012    Family History  Problem Relation Age of Onset   Breast cancer Maternal Grandmother 70   Pancreatic cancer Maternal Uncle 60   Colon cancer Maternal Aunt 60    Social History   Socioeconomic History   Marital status: Single    Spouse name: Not on file   Number of children: Not on file   Years of education: Not on file   Highest education level: Not on file  Occupational History   Not on file  Tobacco Use   Smoking status: Former   Smokeless tobacco: Never  Vaping Use   Vaping Use: Never used  Substance and Sexual Activity   Alcohol use: Yes    Alcohol/week: 0.0 standard drinks of alcohol   Drug use: No   Sexual activity: Yes    Birth control/protection: Pill  Other Topics Concern   Not on file  Social History Narrative   Not on file   Social Determinants of Health   Financial Resource Strain: Not on file  Food Insecurity: Not on file  Transportation Needs: Not on file  Physical Activity: Not on file  Stress: Not  on file  Social Connections: Not on file  Intimate Partner Violence: Not on file     Current Outpatient Medications:    albuterol (VENTOLIN HFA) 108 (90 Base) MCG/ACT inhaler, Inhale 2 puffs into the lungs every 6 (six) hours as needed for wheezing or shortness of breath., Disp: 8 g, Rfl: 2   ALPRAZolam (XANAX) 0.25 MG tablet, Take 1 tablet (0.25 mg total) by mouth 2 (two) times daily as needed for anxiety., Disp: 30 tablet, Rfl: 0   EPINEPHrine 0.3 mg/0.3 mL IJ SOAJ injection, Inject 0.3 mg into the muscle as needed for anaphylaxis (Inject into thigh muscle and call EMS)., Disp: 1 each, Rfl: 0    Lidocaine 3 % CREA, Apply 1 application  topically 2 (two) times daily as needed., Disp: 30 g, Rfl: 3   norgestimate-ethinyl estradiol (ORTHO-CYCLEN) 0.25-35 MG-MCG tablet, TAKE 1 TABLET BY MOUTH EVERY DAY, Disp: 84 tablet, Rfl: 0   phentermine (ADIPEX-P) 37.5 MG tablet, Take 1 tablet (37.5 mg total) by mouth daily before breakfast. (Patient not taking: Reported on 04/19/2021), Disp: 30 tablet, Rfl: 0   sertraline (ZOLOFT) 100 MG tablet, TAKE 1 TABLET BY MOUTH EVERY DAY, Disp: 90 tablet, Rfl: 0   traZODone (DESYREL) 50 MG tablet, TAKE 0.5-1 TABLETS (25-50 MG TOTAL) BY MOUTH AT BEDTIME AS NEEDED. FOR SLEEP, Disp: 90 tablet, Rfl: 0   valACYclovir (VALTREX) 1000 MG tablet, Take 0.5 tablets (500 mg total) by mouth 2 (two) times daily., Disp: 8 tablet, Rfl: 3   ROS:  Review of Systems  Constitutional:  Positive for fatigue. Negative for fever and unexpected weight change.  Respiratory:  Negative for cough, shortness of breath and wheezing.   Cardiovascular:  Negative for chest pain, palpitations and leg swelling.  Gastrointestinal:  Negative for blood in stool, constipation, diarrhea, nausea and vomiting.  Endocrine: Negative for cold intolerance, heat intolerance and polyuria.  Genitourinary:  Positive for dyspareunia. Negative for dysuria, flank pain, frequency, genital sores, hematuria, menstrual problem, pelvic pain, urgency, vaginal bleeding, vaginal discharge and vaginal pain.  Musculoskeletal:  Positive for arthralgias. Negative for back pain, joint swelling and myalgias.  Skin:  Negative for rash.  Neurological:  Negative for dizziness, syncope, light-headedness, numbness and headaches.  Hematological:  Negative for adenopathy.  Psychiatric/Behavioral:  Positive for agitation and sleep disturbance. Negative for confusion and suicidal ideas. The patient is not nervous/anxious.    BREAST: No symptoms   Objective: There were no vitals taken for this visit.   Physical Exam Constitutional:       Appearance: She is well-developed.  Genitourinary:     Vulva normal.     Right Labia: No rash, tenderness or lesions.    Left Labia: No tenderness, lesions or rash.    No vaginal discharge, erythema or tenderness.      Right Adnexa: not tender and no mass present.    Left Adnexa: not tender and no mass present.    No cervical friability or polyp.     Uterus is tender.     Uterus is not enlarged.  Breasts:    Right: No mass, nipple discharge, skin change or tenderness.     Left: No mass, nipple discharge, skin change or tenderness.  Neck:     Thyroid: No thyromegaly.  Cardiovascular:     Rate and Rhythm: Normal rate and regular rhythm.     Heart sounds: Normal heart sounds. No murmur heard. Pulmonary:     Effort: Pulmonary effort is normal.     Breath  sounds: Normal breath sounds.  Abdominal:     Palpations: Abdomen is soft.     Tenderness: There is abdominal tenderness in the suprapubic area. There is no guarding or rebound.  Musculoskeletal:        General: Normal range of motion.     Cervical back: Normal range of motion.  Lymphadenopathy:     Cervical: No cervical adenopathy.  Neurological:     General: No focal deficit present.     Mental Status: She is alert and oriented to person, place, and time.     Cranial Nerves: No cranial nerve deficit.  Skin:    General: Skin is warm and dry.  Psychiatric:        Mood and Affect: Mood normal.        Behavior: Behavior normal.        Thought Content: Thought content normal.        Judgment: Judgment normal.  Vitals reviewed.     Assessment/Plan: Encounter for annual routine gynecological examination  Encounter for surveillance of contraceptive pills - Plan: norgestimate-ethinyl estradiol (ORTHO-CYCLEN) 0.25-35 MG-MCG tablet; OCP RF  Breakthrough bleeding on OCPs-- Most likely due to generic pills or stress. Checking labs and GYN u/s anyway. F/u if sx persist.   Family history of breast cancer - Plan: Integrated  BRACAnalysis ([place]); MyRisk testing discussed and done today  Dyspareunia in female - Plan: US PELVIS TRANSVAGINAL NON-OB (TV ONLY); tender on exam. Check GYN u/s, will call with results.   Unexplained night sweats - Plan: TSH, Comprehensive metabolic panel, CBC with Differential/Platelet; check labs. If neg, most likely due to stress. F/u prn.  Thyroid disorder screening - Plan: TSH  Weight loss - Plan: phentermine (ADIPEX-P) 37.5 MG tablet; Rx phentermine for 2 months. F/u prn.   No orders of the defined types were placed in this encounter.             GYN counsel breast self exam, mammography screening, menopause, adequate intake of calcium and vitamin D, diet and exercise     F/U  No follow-ups on file.  Emanuele Mcwhirter B. Yavonne Kiss, PA-C 03/20/2022 9:13 AM

## 2022-03-22 ENCOUNTER — Encounter: Payer: Self-pay | Admitting: Obstetrics and Gynecology

## 2022-03-22 ENCOUNTER — Other Ambulatory Visit (HOSPITAL_COMMUNITY)
Admission: RE | Admit: 2022-03-22 | Discharge: 2022-03-22 | Disposition: A | Discharge status: Home / Self Care | Payer: 59 | Source: Ambulatory Visit | Attending: Obstetrics and Gynecology | Admitting: Obstetrics and Gynecology

## 2022-03-22 ENCOUNTER — Ambulatory Visit (INDEPENDENT_AMBULATORY_CARE_PROVIDER_SITE_OTHER): Payer: 59 | Admitting: Obstetrics and Gynecology

## 2022-03-22 VITALS — BP 100/60 | Ht 64.0 in | Wt 191.0 lb

## 2022-03-22 DIAGNOSIS — Z3041 Encounter for surveillance of contraceptive pills: Secondary | ICD-10-CM

## 2022-03-22 DIAGNOSIS — Z1151 Encounter for screening for human papillomavirus (HPV): Secondary | ICD-10-CM | POA: Insufficient documentation

## 2022-03-22 DIAGNOSIS — N94819 Vulvodynia, unspecified: Secondary | ICD-10-CM

## 2022-03-22 DIAGNOSIS — Z124 Encounter for screening for malignant neoplasm of cervix: Secondary | ICD-10-CM

## 2022-03-22 DIAGNOSIS — Z803 Family history of malignant neoplasm of breast: Secondary | ICD-10-CM

## 2022-03-22 DIAGNOSIS — R69 Illness, unspecified: Secondary | ICD-10-CM | POA: Diagnosis not present

## 2022-03-22 DIAGNOSIS — R634 Abnormal weight loss: Secondary | ICD-10-CM

## 2022-03-22 DIAGNOSIS — N941 Unspecified dyspareunia: Secondary | ICD-10-CM

## 2022-03-22 DIAGNOSIS — Z01419 Encounter for gynecological examination (general) (routine) without abnormal findings: Secondary | ICD-10-CM | POA: Diagnosis not present

## 2022-03-22 MED ORDER — NORGESTIMATE-ETH ESTRADIOL 0.25-35 MG-MCG PO TABS: ORAL_TABLET | ORAL | 3 refills | Status: DC

## 2022-03-22 MED ORDER — PHENTERMINE HCL 37.5 MG PO TABS: 37.5000 mg | ORAL_TABLET | Freq: Every day | ORAL | 1 refills | Status: DC

## 2022-03-22 NOTE — Patient Instructions (Signed)
I value your feedback and you entrusting us with your care. If you get a [facility] patient survey, I would appreciate you taking the time to let us know about your experience today. Thank you! ? ? ?

## 2022-03-28 LAB — CYTOLOGY - PAP
Comment: NEGATIVE
Diagnosis: NEGATIVE
High risk HPV: NEGATIVE

## 2022-04-07 ENCOUNTER — Ambulatory Visit: Payer: 59 | Admitting: Family Medicine

## 2022-05-16 DIAGNOSIS — M47896 Other spondylosis, lumbar region: Secondary | ICD-10-CM | POA: Diagnosis not present

## 2022-05-16 DIAGNOSIS — M533 Sacrococcygeal disorders, not elsewhere classified: Secondary | ICD-10-CM | POA: Diagnosis not present

## 2022-05-16 DIAGNOSIS — M545 Low back pain, unspecified: Secondary | ICD-10-CM | POA: Diagnosis not present

## 2022-05-16 DIAGNOSIS — M47816 Spondylosis without myelopathy or radiculopathy, lumbar region: Secondary | ICD-10-CM | POA: Diagnosis not present

## 2022-05-19 ENCOUNTER — Other Ambulatory Visit: Payer: Self-pay | Admitting: Family Medicine

## 2022-05-19 DIAGNOSIS — F5101 Primary insomnia: Secondary | ICD-10-CM

## 2022-05-19 DIAGNOSIS — F329 Major depressive disorder, single episode, unspecified: Secondary | ICD-10-CM

## 2022-05-20 NOTE — Telephone Encounter (Signed)
Requested medications are due for refill today.  yes  Requested medications are on the active medications list.  yes  Last refill. 02/28/2022 #90 [id number]  Future visit scheduled.   no  Notes to clinic.  Labs are expired.    Requested Prescriptions  Pending Prescriptions Disp Refills   sertraline (ZOLOFT) 100 MG tablet [Pharmacy Med Name: SERTRALINE HCL 100 MG TABLET] 90 tablet 1    Sig: TAKE 1 TABLET BY MOUTH EVERY DAY     Psychiatry:  Antidepressants - SSRI - sertraline Failed - 05/19/2022  2:34 PM      Failed - AST in normal range and within 360 days    AST  Date Value Ref Range Status  01/04/2021 22 0 - 40 IU/L Final         Failed - ALT in normal range and within 360 days    ALT  Date Value Ref Range Status  01/04/2021 13 0 - 32 IU/L Final         Passed - Completed PHQ-2 or PHQ-9 in the last 360 days      Passed - Valid encounter within last 6 months    Recent Outpatient Visits           5 months ago Acute maxillary sinusitis, recurrence not specified   [facility] [place], Deanna C, MD   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

## 2022-05-20 NOTE — Telephone Encounter (Signed)
Requested Prescriptions  Pending Prescriptions Disp Refills   sertraline (ZOLOFT) 100 MG tablet [Pharmacy Med Name: SERTRALINE HCL 100 MG TABLET] 90 tablet 1    Sig: TAKE 1 TABLET BY MOUTH EVERY DAY     Psychiatry:  Antidepressants - SSRI - sertraline Failed - 05/19/2022  2:34 PM      Failed - AST in normal range and within 360 days    AST  Date Value Ref Range Status  01/04/2021 22 0 - 40 IU/L Final         Failed - ALT in normal range and within 360 days    ALT  Date Value Ref Range Status  01/04/2021 13 0 - 32 IU/L Final         Passed - Completed PHQ-2 or PHQ-9 in the last 360 days      Passed - Valid encounter within last 6 months    Recent Outpatient Visits           5 months ago Acute maxillary sinusitis, recurrence not specified   [place] [facility] [place], Deanna C, MD   1 year 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

## 2022-06-09 ENCOUNTER — Other Ambulatory Visit: Payer: Self-pay | Admitting: Obstetrics and Gynecology

## 2022-06-09 DIAGNOSIS — R634 Abnormal weight loss: Secondary | ICD-10-CM

## 2022-06-18 ENCOUNTER — Other Ambulatory Visit: Payer: Self-pay | Admitting: Family Medicine

## 2022-06-18 DIAGNOSIS — F329 Major depressive disorder, single episode, unspecified: Secondary | ICD-10-CM

## 2022-06-20 NOTE — Telephone Encounter (Signed)
Requested medication (s) are due for refill today: yes  Requested medication (s) are on the active medication list: yes  Last refill:  05/20/22 #30  Future visit scheduled: no  Notes to clinic:  called pt and LM on VM to make appt/overdue for lab work and appointment   Requested Prescriptions  Pending Prescriptions Disp Refills   sertraline (ZOLOFT) 100 MG tablet [Pharmacy Med Name: SERTRALINE HCL 100 MG TABLET] 90 tablet 1    Sig: TAKE 1 TABLET BY [place]     Psychiatry:  Antidepressants - SSRI - sertraline Failed - 06/18/2022  9:31 AM      Failed - AST in normal range and within 360 days    AST  Date Value Ref Range Status  01/04/2021 22 0 - 40 IU/L Final         Failed - ALT in normal range and within 360 days    ALT  Date Value Ref Range Status  01/04/2021 13 0 - 32 IU/L Final         Failed - Valid encounter within last 6 months    Recent Outpatient Visits           6 months ago Acute maxillary sinusitis, recurrence not specified   [place] [facility] [place], Deanna C, MD   1 year 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

## 2022-06-28 ENCOUNTER — Ambulatory Visit: Payer: 59 | Admitting: Family Medicine

## 2022-07-01 ENCOUNTER — Encounter: Payer: Self-pay | Admitting: Family Medicine

## 2022-07-15 ENCOUNTER — Other Ambulatory Visit: Payer: Self-pay | Admitting: Family Medicine

## 2022-07-15 DIAGNOSIS — F329 Major depressive disorder, single episode, unspecified: Secondary | ICD-10-CM

## 2022-07-15 NOTE — Telephone Encounter (Signed)
Requested medications are due for refill today.  yes  Requested medications are on the active medications list.  yes  Last refill. 06/20/2022 #30  Future visit scheduled.   no  Notes to clinic.  Pt already given a courtesy refill. Labs are expired.    Requested Prescriptions  Pending Prescriptions Disp Refills   sertraline (ZOLOFT) 100 MG tablet [Pharmacy Med Name: SERTRALINE HCL 100 MG TABLET] 90 tablet 1    Sig: TAKE 1 TABLET BY MOUTH EVERY DAY     Psychiatry:  Antidepressants - SSRI - sertraline Failed - 07/15/2022  2:34 PM      Failed - AST in normal range and within 360 days    AST  Date Value Ref Range Status  01/04/2021 22 0 - 40 IU/L Final         Failed - ALT in normal range and within 360 days    ALT  Date Value Ref Range Status  01/04/2021 13 0 - 32 IU/L Final         Failed - Valid encounter within last 6 months    Recent Outpatient Visits           7 months ago Acute maxillary sinusitis, recurrence not specified   [place] [facility], MD   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

## 2022-07-19 ENCOUNTER — Other Ambulatory Visit: Payer: Self-pay | Admitting: Family Medicine

## 2022-07-19 DIAGNOSIS — F329 Major depressive disorder, single episode, unspecified: Secondary | ICD-10-CM

## 2022-07-29 ENCOUNTER — Other Ambulatory Visit: Payer: Self-pay | Admitting: Family Medicine

## 2022-07-29 DIAGNOSIS — F329 Major depressive disorder, single episode, unspecified: Secondary | ICD-10-CM

## 2022-07-29 NOTE — Telephone Encounter (Signed)
Unable to refill per protocol, courtesy refill already given, OV needed for additional refills.  Requested Prescriptions  Pending Prescriptions Disp Refills   sertraline (ZOLOFT) 100 MG tablet [Pharmacy Med Name: SERTRALINE HCL 100 MG TABLET] 90 tablet 1    Sig: TAKE 1 TABLET BY MOUTH EVERY DAY     Psychiatry:  Antidepressants - SSRI - sertraline Failed - 07/29/2022  8:31 AM      Failed - AST in normal range and within 360 days    AST  Date Value Ref Range Status  01/04/2021 22 0 - 40 IU/L Final         Failed - ALT in normal range and within 360 days    ALT  Date Value Ref Range Status  01/04/2021 13 0 - 32 IU/L Final         Failed - Valid encounter within last 6 months    Recent Outpatient Visits           8 months ago Acute maxillary sinusitis, recurrence not specified   [place] [facility]   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

## 2022-08-16 DIAGNOSIS — J0140 Acute pansinusitis, unspecified: Secondary | ICD-10-CM | POA: Diagnosis not present

## 2022-08-16 DIAGNOSIS — J40 Bronchitis, not specified as acute or chronic: Secondary | ICD-10-CM | POA: Diagnosis not present

## 2022-08-17 ENCOUNTER — Other Ambulatory Visit: Payer: Self-pay | Admitting: Family Medicine

## 2022-08-17 DIAGNOSIS — F5101 Primary insomnia: Secondary | ICD-10-CM

## 2022-08-17 NOTE — Telephone Encounter (Signed)
Requested medication (s) are due for refill today: yes  Requested medication (s) are on the active medication list: yes  Last refill:  05/20/22  Future visit scheduled: no  Notes to clinic:  Unable to refill per protocol, courtesy refill already given, routing for provider approval.      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

## 2022-11-03 DIAGNOSIS — L538 Other specified erythematous conditions: Secondary | ICD-10-CM | POA: Diagnosis not present

## 2022-11-03 DIAGNOSIS — L728 Other follicular cysts of the skin and subcutaneous tissue: Secondary | ICD-10-CM | POA: Diagnosis not present

## 2022-11-03 DIAGNOSIS — B078 Other viral warts: Secondary | ICD-10-CM | POA: Diagnosis not present

## 2022-11-03 DIAGNOSIS — L578 Other skin changes due to chronic exposure to nonionizing radiation: Secondary | ICD-10-CM | POA: Diagnosis not present

## 2022-11-21 ENCOUNTER — Encounter: Payer: Self-pay | Admitting: *Deleted

## 2022-11-21 ENCOUNTER — Telehealth: Payer: Self-pay | Admitting: *Deleted

## 2022-11-21 NOTE — Telephone Encounter (Signed)
I attempted to contact patient by telephone but was unsuccessful. According to the patient's chart they are due for follow up  with [facility]. I have left a HIPAA compliant message advising the patient to contact [facility]. I will continue to follow up with the patient to make sure this appointment is scheduled.

## 2022-12-01 DIAGNOSIS — Z130 Encounter for screening for diseases of the blood and blood-forming organs and certain disorders involving the immune mechanism: Secondary | ICD-10-CM | POA: Diagnosis not present

## 2022-12-01 DIAGNOSIS — Z8349 Family history of other endocrine, nutritional and metabolic diseases: Secondary | ICD-10-CM | POA: Diagnosis not present

## 2022-12-01 DIAGNOSIS — Z131 Encounter for screening for diabetes mellitus: Secondary | ICD-10-CM | POA: Diagnosis not present

## 2022-12-01 DIAGNOSIS — Z136 Encounter for screening for cardiovascular disorders: Secondary | ICD-10-CM | POA: Diagnosis not present

## 2022-12-01 DIAGNOSIS — F411 Generalized anxiety disorder: Secondary | ICD-10-CM | POA: Diagnosis not present

## 2022-12-01 DIAGNOSIS — Z1331 Encounter for screening for depression: Secondary | ICD-10-CM | POA: Diagnosis not present

## 2022-12-01 DIAGNOSIS — Z0000 Encounter for general adult medical examination without abnormal findings: Secondary | ICD-10-CM | POA: Diagnosis not present

## 2022-12-01 DIAGNOSIS — Z13228 Encounter for screening for other metabolic disorders: Secondary | ICD-10-CM | POA: Diagnosis not present

## 2022-12-01 DIAGNOSIS — Z13220 Encounter for screening for lipoid disorders: Secondary | ICD-10-CM | POA: Diagnosis not present

## 2022-12-13 DIAGNOSIS — Z1379 Encounter for other screening for genetic and chromosomal anomalies: Secondary | ICD-10-CM | POA: Diagnosis not present

## 2022-12-13 DIAGNOSIS — Z8349 Family history of other endocrine, nutritional and metabolic diseases: Secondary | ICD-10-CM | POA: Diagnosis not present

## 2022-12-29 ENCOUNTER — Encounter: Payer: Self-pay | Admitting: Obstetrics and Gynecology

## 2022-12-29 ENCOUNTER — Ambulatory Visit: Payer: 59 | Admitting: Obstetrics and Gynecology

## 2022-12-29 VITALS — BP 112/71 | HR 91 | Ht 64.0 in | Wt 181.0 lb

## 2022-12-29 DIAGNOSIS — R634 Abnormal weight loss: Secondary | ICD-10-CM

## 2022-12-29 DIAGNOSIS — R350 Frequency of micturition: Secondary | ICD-10-CM

## 2022-12-29 DIAGNOSIS — B3731 Acute candidiasis of vulva and vagina: Secondary | ICD-10-CM | POA: Diagnosis not present

## 2022-12-29 LAB — POCT URINALYSIS DIPSTICK
Bilirubin, UA: NEGATIVE
Blood, UA: NEGATIVE
Glucose, UA: NEGATIVE
Ketones, UA: NEGATIVE
Leukocytes, UA: NEGATIVE
Nitrite, UA: NEGATIVE
Protein, UA: NEGATIVE
Spec Grav, UA: 1.02 (ref 1.010–1.025)
pH, UA: 5 (ref 5.0–8.0)

## 2022-12-29 LAB — POCT WET PREP WITH KOH
Clue Cells Wet Prep HPF POC: NEGATIVE
KOH Prep POC: NEGATIVE
Trichomonas, UA: NEGATIVE
Yeast Wet Prep HPF POC: POSITIVE

## 2022-12-29 MED ORDER — PHENTERMINE HCL 37.5 MG PO TABS: 37.5000 mg | ORAL_TABLET | Freq: Every day | ORAL | 1 refills | Status: DC

## 2022-12-29 MED ORDER — FLUCONAZOLE 150 MG PO TABS: 150.0000 mg | ORAL_TABLET | Freq: Once | ORAL | 0 refills | Status: AC

## 2022-12-29 NOTE — Patient Instructions (Signed)
I value your feedback and you entrusting us with your care. If you get a [facility] patient survey, I would appreciate you taking the time to let us know about your experience today. Thank you! ? ? ?

## 2022-12-29 NOTE — Progress Notes (Signed)
Kelsey Limerick, MD   Chief Complaint  Patient presents with   Urinary Tract Infection    Frequent urination and little burning, back pain x 4 days   Vaginal Exam    Possible sore in vaginal area, tender and hurts when wipes x 4 days    HPI:      Kelsey Bryant is a 37 y.o. [id number] whose LMP was Patient's last menstrual period was 11/28/2022 (approximate)., presents today for vaginal pain LT labia for a few days, hurts to wipe. No increased vag d/c/odor. Was in bathing suit over wknd. No meds to treat. Pt also with urinary frequency with good flow, mild dysuria, LBP, no hematuria. No pelvic pain/fevers. Drinking lots of tea, little water.  Would like Rx RF phentermine for wt loss. Took for 2 months last yr with 15# wt loss; has gained it back. Plans to do diet changes with it.    Patient Active Problem List   Diagnosis Date Noted   Family history of breast cancer 01/04/2021    Past Surgical History:  Procedure Laterality Date   COLONOSCOPY WITH PROPOFOL N/A 12/24/2021   Procedure: COLONOSCOPY WITH PROPOFOL;  Surgeon: Regis Bill, MD;  Location: [facility] ENDOSCOPY;  Service: Endoscopy;  Laterality: N/A;   ESOPHAGOGASTRODUODENOSCOPY (EGD) WITH PROPOFOL N/A 12/24/2021   Procedure: ESOPHAGOGASTRODUODENOSCOPY (EGD) WITH PROPOFOL;  Surgeon: Regis Bill, MD;  Location: [facility] ENDOSCOPY;  Service: Endoscopy;  Laterality: N/A;   INTRAUTERINE DEVICE (IUD) INSERTION  2012    Family History  Problem Relation Age of Onset   Colon cancer Maternal Aunt 60   Pancreatic cancer Maternal Uncle 60   Breast cancer Maternal Grandmother 4   Bladder Cancer Paternal Grandfather     Social History   Socioeconomic History   Marital status: Single    Spouse name: Not on file   Number of children: Not on file   Years of education: Not on file   Highest education level: Not on file  Occupational History   Not on file  Tobacco Use   Smoking status: Former   Smokeless tobacco:  Never  Vaping Use   Vaping status: Never Used  Substance and Sexual Activity   Alcohol use: Yes    Alcohol/week: 0.0 standard drinks of alcohol   Drug use: No   Sexual activity: Yes    Birth control/protection: Pill  Other Topics Concern   Not on file  Social History Narrative   Not on file   Social Determinants of Health   Financial Resource Strain: Low Risk  (12/01/2022)   Received from [facility]   Overall Financial Resource Strain (CARDIA)    Difficulty of Paying Living Expenses: Not very hard  Food Insecurity: No Food Insecurity (12/01/2022)   Received from [facility]   Hunger Vital Sign    Worried About Running Out of Food in the Last Year: Never true    Ran Out of Food in the Last Year: Never true  Transportation Needs: No Transportation Needs (12/01/2022)   Received from [facility] - Transportation    In the past 12 months, has lack of transportation kept you from medical appointments or from getting medications?: No    Lack of Transportation (Non-Medical): No  Physical Activity: Not on file  Stress: Not on file  Social Connections: Not on file  Intimate Partner Violence: Not on file    Outpatient Medications Prior to Visit  Medication  Sig Dispense Refill   albuterol (VENTOLIN HFA) 108 (90 Base) MCG/ACT inhaler Inhale 2 puffs into the lungs every 6 (six) hours as needed for wheezing or shortness of breath. 8 g 2   ALPRAZolam (XANAX) 0.25 MG tablet Take 1 tablet (0.25 mg total) by mouth 2 (two) times daily as needed for anxiety. 30 tablet 0   norgestimate-ethinyl estradiol (ORTHO-CYCLEN) 0.25-35 MG-MCG tablet TAKE 1 TABLET BY MOUTH EVERY DAY 84 tablet 3   sertraline (ZOLOFT) 100 MG tablet TAKE 1 TABLET BY MOUTH EVERY DAY 15 tablet 0   traZODone (DESYREL) 50 MG tablet TAKE 0.5-1 TABLETS (25-50 MG TOTAL) BY MOUTH AT BEDTIME AS NEEDED. FOR SLEEP 90 tablet 0   valACYclovir (VALTREX) 1000 MG tablet Take 0.5  tablets (500 mg total) by mouth 2 (two) times daily. 8 tablet 3   EPINEPHrine 0.3 mg/0.3 mL IJ SOAJ injection Inject 0.3 mg into the muscle as needed for anaphylaxis (Inject into thigh muscle and call EMS). 1 each 0   Lidocaine 3 % CREA Apply 1 application  topically 2 (two) times daily as needed. 30 g 3   omeprazole (PRILOSEC) 20 MG capsule Take by mouth.     phentermine (ADIPEX-P) 37.5 MG tablet Take 1 tablet (37.5 mg total) by mouth daily before breakfast. 30 tablet 1   No facility-administered medications prior to visit.      ROS:  Review of Systems  Constitutional:  Negative for fever.  Gastrointestinal:  Negative for blood in stool, constipation, diarrhea, nausea and vomiting.  Genitourinary:  Positive for dysuria, frequency and vaginal pain. Negative for dyspareunia, flank pain, hematuria, urgency, vaginal bleeding and vaginal discharge.  Musculoskeletal:  Negative for back pain.  Skin:  Negative for rash.   BREAST: No symptoms   OBJECTIVE:   Vitals:  BP 112/71   Pulse 91   Ht 5\' 4"  (1.626 m)   Wt 181 lb (82.1 kg)   LMP 11/28/2022 (Approximate)   BMI 31.07 kg/m   Physical Exam Vitals reviewed.  Constitutional:      Appearance: She is well-developed.  Pulmonary:     Effort: Pulmonary effort is normal.  Genitourinary:    General: Normal vulva.     Pubic Area: No rash.      Labia:        Right: No rash, tenderness or lesion.        Left: Tenderness present. No rash or lesion.      Vagina: Vaginal discharge present. No erythema or tenderness.     Cervix: Normal.     Uterus: Normal. Not enlarged and not tender.      Adnexa: Right adnexa normal and left adnexa normal.       Right: No mass or tenderness.         Left: No mass or tenderness.         Comments: THICK, WHITE CLUMPY VAG D/C Musculoskeletal:        General: Normal range of motion.     Cervical back: Normal range of motion.  Skin:    General: Skin is warm and dry.  Neurological:     General: No  focal deficit present.     Mental Status: She is alert and oriented to person, place, and time.  Psychiatric:        Mood and Affect: Mood normal.        Behavior: Behavior normal.        Thought Content: Thought content normal.  Judgment: Judgment normal.     Results: Results for orders placed or performed in visit on 12/29/22 (from the past 24 hour(s))  POCT Wet Prep with KOH     Status: Abnormal   Collection Time: 12/29/22  9:14 AM  Result Value Ref Range   Trichomonas, UA Negative    Clue Cells Wet Prep HPF POC neg    Epithelial Wet Prep HPF POC     Yeast Wet Prep HPF POC pos    Bacteria Wet Prep HPF POC     RBC Wet Prep HPF POC     WBC Wet Prep HPF POC     KOH Prep POC Negative Negative  POCT Urinalysis Dipstick     Status: Normal   Collection Time: 12/29/22  9:14 AM  Result Value Ref Range   Color, UA yellow    Clarity, UA clear    Glucose, UA Negative Negative   Bilirubin, UA neg    Ketones, UA neg    Spec Grav, UA 1.020 1.010 - 1.025   Blood, UA neg    pH, UA 5.0 5.0 - 8.0   Protein, UA Negative Negative   Urobilinogen, UA     Nitrite, UA neg    Leukocytes, UA Negative Negative   Appearance     Odor       Assessment/Plan: Candidal vaginitis - Plan: fluconazole (DIFLUCAN) 150 MG tablet, POCT Wet Prep with KOH; pos exam and wet prep. Rx diflucan, f/u prn.   Urinary frequency - Plan: POCT Urinalysis Dipstick; neg UA. D/c caffeine, increase water intake. Dysuria from yeast vag.   Weight loss - Plan: phentermine (ADIPEX-P) 37.5 MG tablet; Rx RF for 2 months per pt request.    Meds ordered this encounter  Medications   fluconazole (DIFLUCAN) 150 MG tablet    Sig: Take 1 tablet (150 mg total) by mouth once for 1 dose.    Dispense:  1 tablet    Refill:  0    Order Specific Question:   Supervising Provider    Answer:   Hildred Laser [[id number]]   phentermine (ADIPEX-P) 37.5 MG tablet    Sig: Take 1 tablet (37.5 mg total) by mouth daily before breakfast.     Dispense:  30 tablet    Refill:  1    Order Specific Question:   Supervising Provider    Answer:   Waymon Budge      Return if symptoms worsen or fail to improve.  Xan Sparkman B. Braeden Dolinski, PA-C 12/29/2022 9:16 AM

## 2022-12-31 IMAGING — US US PELVIS COMPLETE WITH TRANSVAGINAL
1 series · 13 of 25 positions shown · non-contrast
Comparison: None

CLINICAL DATA: Dyspareunia in a female, LMP 01/13/2021

EXAM:
TRANSABDOMINAL AND TRANSVAGINAL ULTRASOUND OF PELVIS
TECHNIQUE: Both transabdominal and transvaginal ultrasound examinations of the
pelvis were performed. Transabdominal technique was performed for
global imaging of the pelvis including uterus, ovaries, adnexal
regions, and pelvic cul-de-sac. It was necessary to proceed with
endovaginal exam following the transabdominal exam to visualize the
lower uterine segment and endometrium as well as adnexa.

[Series 1: us pelvis complete with transvaginal · 0.25mm/px · 13 of 115 slices shown]
[im 1/115]
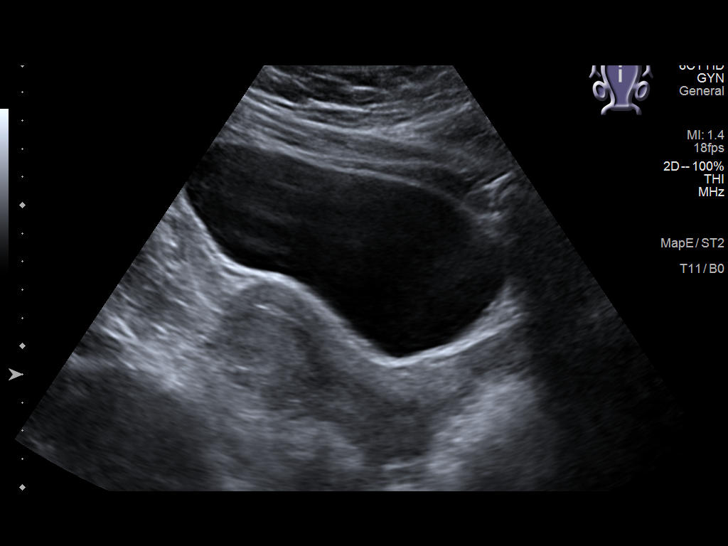
[im 10/115]
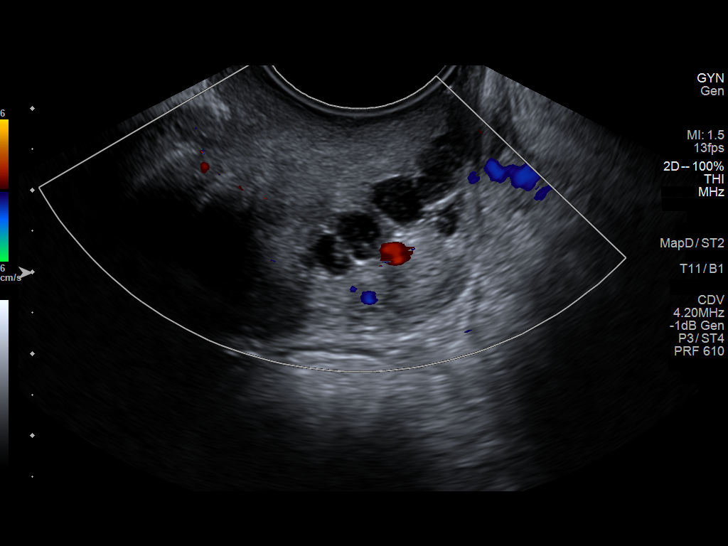
[im 20/115]
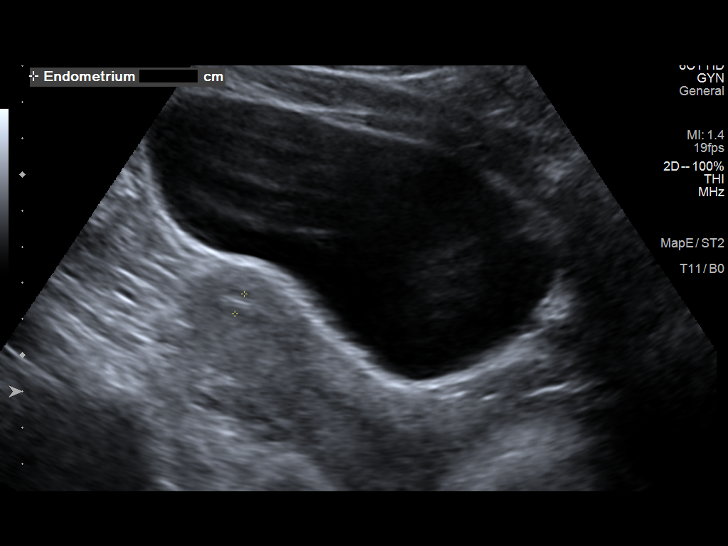
[im 29/115]
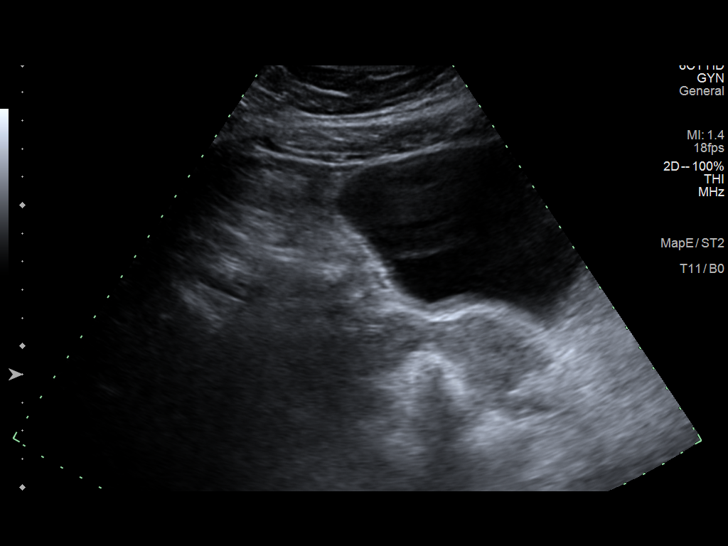
[im 39/115]
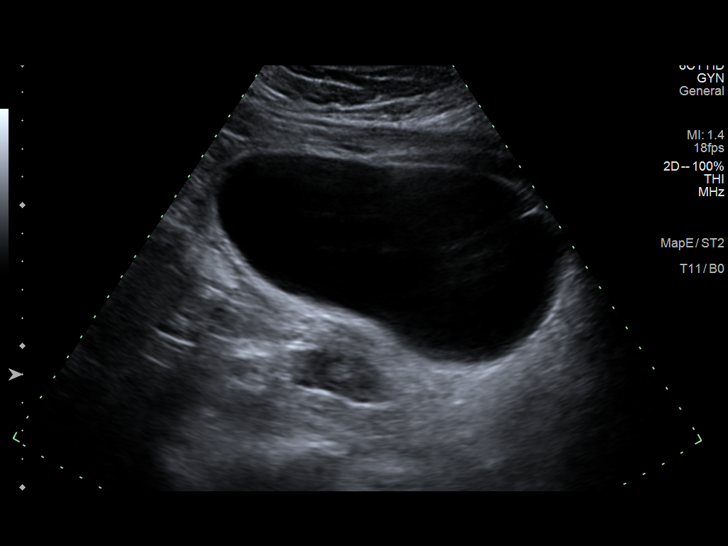
[im 48/115]
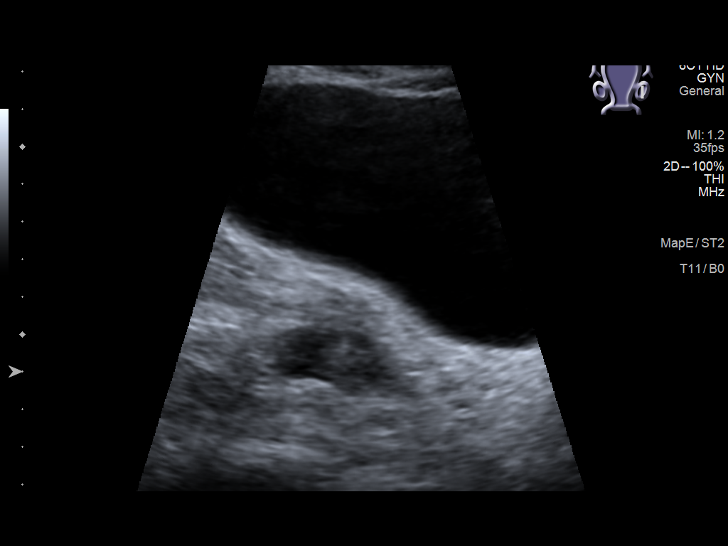
[im 58/115]
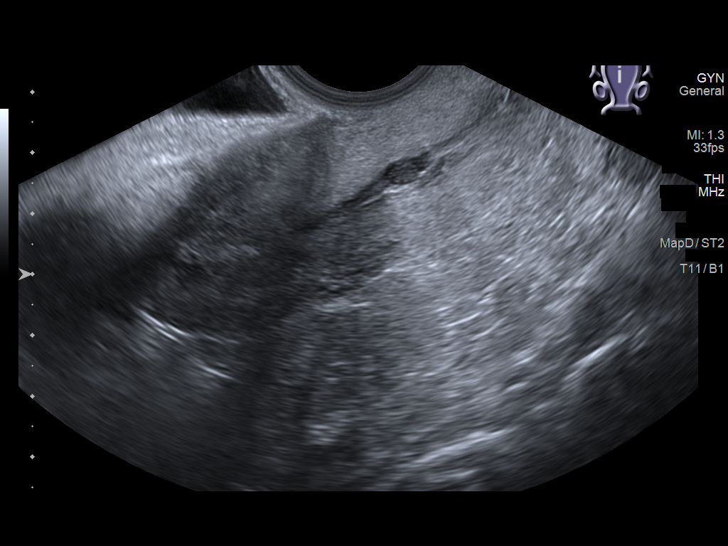
[im 67/115]
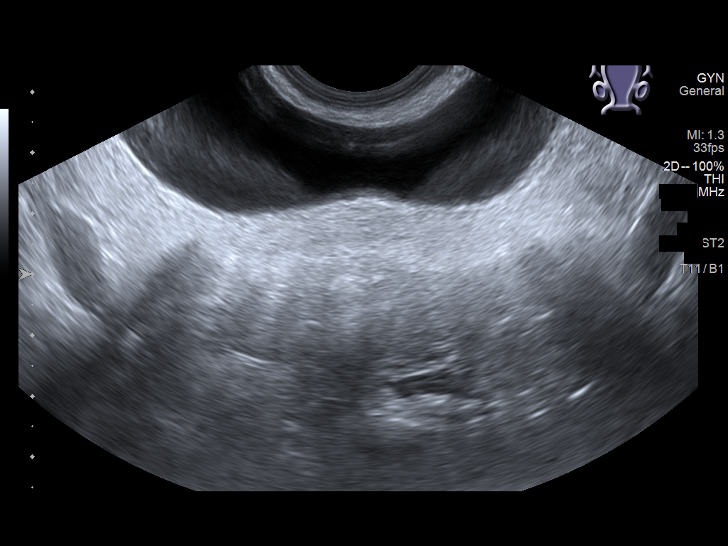
[im 77/115]
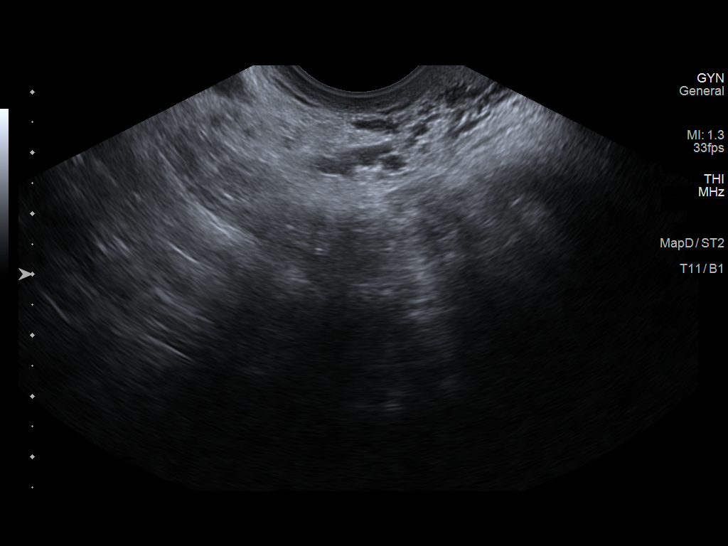
[im 86/115]
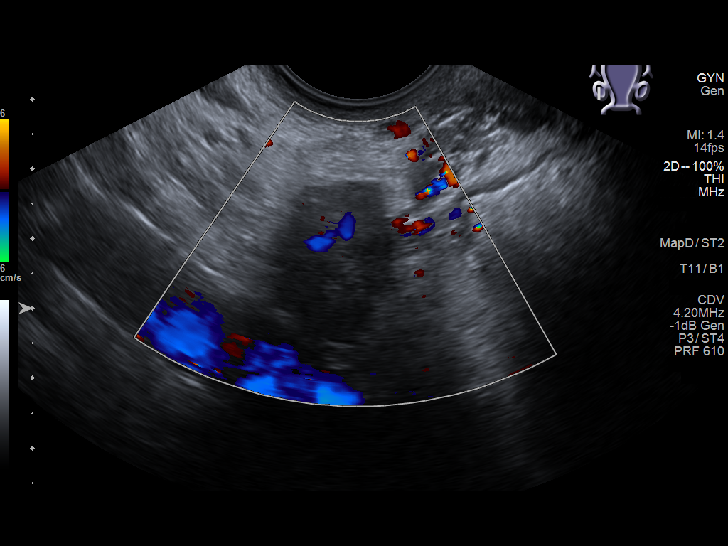
[im 96/115]
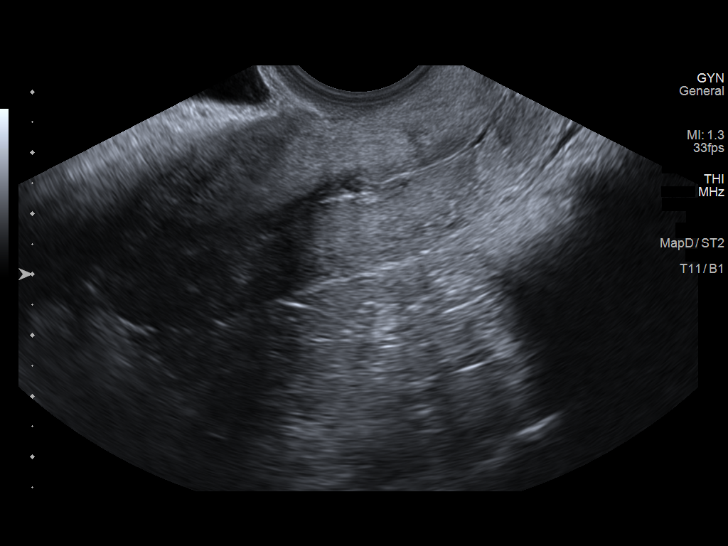
[im 105/115]
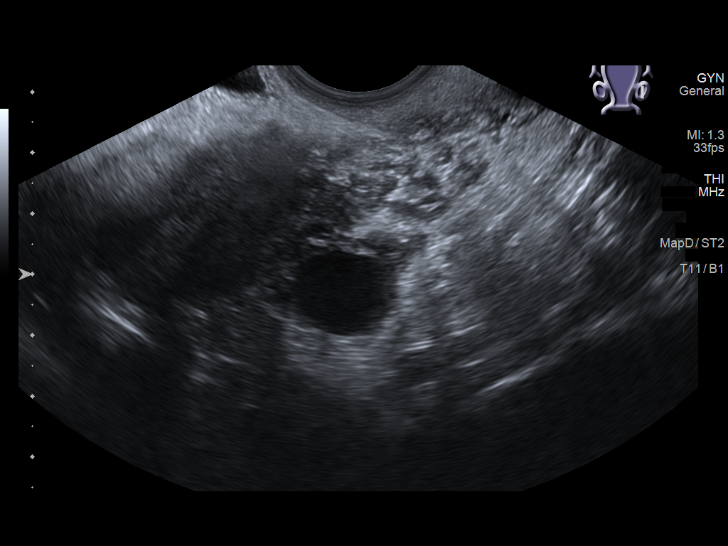
[im 115/115]
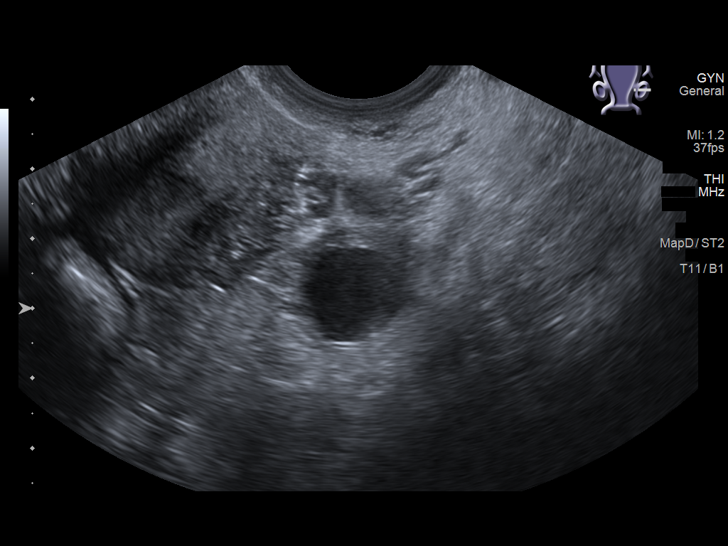

[13 of 25 positions shown; findings below may reference images not displayed]

FINDINGS: Uterus

Measurements: 7.2 x 3.1 x 4.2 cm = volume: 48 mL. Anteverted. Normal
morphology without mass

Endometrium

Thickness: 7 mm. No endometrial fluid or mass. Hypoechoic nodule
identified within the endocervical canal cannot exclude endocervical
polyp 6 mm diameter.

Right ovary

Measurements: 2.7 x 1.7 x 1.8 cm = volume: 4.4 mL. Normal morphology
without mass

Left ovary

Measurements: 2.4 x 1.8 x 1.5 cm = volume: 3.4 mL. Normal morphology
without mass

Other findings

No free pelvic fluid.  No adnexal masses.
IMPRESSION: Unremarkable uterus and ovaries.

Potential 6 mm endocervical polyp; consider sonohysterogram for
further evaluation, prior to hysteroscopy or endometrial biopsy.

## 2023-01-05 ENCOUNTER — Telehealth: Payer: Self-pay

## 2023-01-05 MED ORDER — FLUCONAZOLE 150 MG PO TABS: 150.0000 mg | ORAL_TABLET | Freq: Once | ORAL | 0 refills | Status: AC

## 2023-01-05 NOTE — Telephone Encounter (Signed)
Pt calling; was seen last Thurs for yeast inf; was rx'd one diflucan pill; feels like she needs another one; still having sxs.  [phone]  Pt aware if one diflucan sent in; if doesn't help pt aware she needs to be seen again; push water.

## 2023-03-03 ENCOUNTER — Other Ambulatory Visit: Payer: Self-pay | Admitting: Obstetrics and Gynecology

## 2023-03-03 DIAGNOSIS — Z3041 Encounter for surveillance of contraceptive pills: Secondary | ICD-10-CM

## 2023-04-18 NOTE — Progress Notes (Signed)
 PCP:  Joshua Cathryne BROCKS, MD   Chief Complaint  Patient presents with   Gynecologic Exam    No concerns     HPI:      [name] is a 38 y.o. [id number] whose LMP was Patient's last menstrual period was 03/26/2023 (approximate)., presents today for her annual examination.  Her menses are regular every 28-30 days, lasting 5 days on OCPs.  Dysmenorrhea mild, occurring first 1-2 days of flow. No BTB.   Sex activity: single partner, contraception - OCP (estrogen/progesterone). No pain/bleeding/dryness. Hx of dyspareunia in past but was GI related. Last Pap: 03/22/22 Results were: no abnormalities /neg HPV DNA  Hx of STDs: none  Treated for yeast vag 9/24 with diflucan  x 2 with sx relief.   There is a FH of breast cancer in her MGM, pancreatic cancer in her mat uncle and colon cancer in her mat aunt. There is no FH of ovarian cancer. The patient does self-breast exams. Pt is MyRisk neg except AXIN2 1 and MSH2 VUS 10/22. IBIS=12.8%/riskscore=9.8%   Colonoscopy: 9/23 with [facility]; hx of diverticulosis/pelvic pain.  Tobacco use: none;  quit cigs Alcohol use: occas No drug use.  Exercise: moderately active  She does not get adequate calcium and Vitamin D in her diet. Labs with PCP. Would like RF phentermine  RF. Has done a couple times with wt loss. Gained wt at Christmas. 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  Past Medical History:  Diagnosis Date   Anxiety    BRCA negative 01/2021   MyRisk neg except AXIN2 and MSH2 VUS   Diverticulosis    Family history of breast cancer 01/2021   IBIS=12.8%.riskscore=9.8%   Insomnia    Ovarian cyst     Past Surgical History:  Procedure Laterality Date   COLONOSCOPY WITH PROPOFOL  N/A 12/24/2021   Procedure: COLONOSCOPY WITH PROPOFOL ;  Surgeon: Maryruth Ole DASEN, MD;  Location: [facility] ENDOSCOPY;  Service: Endoscopy;  Laterality: N/A;   ESOPHAGOGASTRODUODENOSCOPY (EGD) WITH PROPOFOL  N/A 12/24/2021   Procedure:  ESOPHAGOGASTRODUODENOSCOPY (EGD) WITH PROPOFOL ;  Surgeon: Maryruth Ole DASEN, MD;  Location: [facility] ENDOSCOPY;  Service: Endoscopy;  Laterality: N/A;   INTRAUTERINE DEVICE (IUD) INSERTION  2012    Family History  Problem Relation Age of Onset   Colon cancer Maternal Aunt 60   Pancreatic cancer Maternal Uncle 60   Breast cancer Maternal Grandmother 59   Bladder Cancer Paternal Grandfather     Social History   Socioeconomic History   Marital status: Single    Spouse name: Not on file   Number of children: Not on file   Years of education: Not on file   Highest education level: Not on file  Occupational History   Not on file  Tobacco Use   Smoking status: Former   Smokeless tobacco: Never  Vaping Use   Vaping status: Never Used  Substance and Sexual Activity   Alcohol use: Yes    Comment: occ   Drug use: No   Sexual activity: Yes    Birth control/protection: Pill  Other Topics Concern   Not on file  Social History Narrative   Not on file   Social Drivers of Health   Financial Resource Strain: Low Risk  (12/01/2022)   Received [facility]   Overall Financial Resource Strain (CARDIA)    Difficulty of Paying Living Expenses: Not very hard  Food Insecurity: No Food Insecurity (12/01/2022)   [facility]  Hunger Vital Sign    Worried About Running Out of Food in the Last Year: Never true    Ran Out of Food in the Last Year: Never true  Transportation Needs: No Transportation Needs (12/01/2022)   Received from [facility]    In the past 12 months, has lack of transportation kept you from medical appointments or from getting medications?: No    Lack of Transportation (Non-Medical): No  Physical Activity: Not on file  Stress: Not on file  Social Connections: Not on file  Intimate Partner Violence: Not on file     Current Outpatient Medications:    albuterol  (VENTOLIN  HFA)  108 (90 Base) MCG/ACT inhaler, Inhale 2 puffs into the lungs every 6 (six) hours as needed for wheezing or shortness of breath., Disp: 8 g, Rfl: 2   ALPRAZolam  (XANAX ) 0.25 MG tablet, Take 1 tablet (0.25 mg total) by mouth 2 (two) times daily as needed for anxiety., Disp: 30 tablet, Rfl: 0   sertraline  (ZOLOFT ) 100 MG tablet, TAKE 1 TABLET BY MOUTH EVERY DAY, Disp: 15 tablet, Rfl: 0   traZODone  (DESYREL ) 50 MG tablet, TAKE 0.5-1 TABLETS (25-50 MG TOTAL) BY MOUTH AT BEDTIME AS NEEDED. FOR SLEEP, Disp: 90 tablet, Rfl: 0   valACYclovir  (VALTREX ) 1000 MG tablet, Take 0.5 tablets (500 mg total) by mouth 2 (two) times daily., Disp: 8 tablet, Rfl: 3   norgestimate -ethinyl estradiol  (ORTHO-CYCLEN) 0.25-35 MG-MCG tablet, TAKE 1 TABLET BY MOUTH EVERY DAY, Disp: 84 tablet, Rfl: 3   phentermine  (ADIPEX-P ) 37.5 MG tablet, Take 1 tablet (37.5 mg total) by mouth daily before breakfast., Disp: 30 tablet, Rfl: 2   ROS:  Review of Systems  Constitutional:  Negative for fatigue, fever and unexpected weight change.  Respiratory:  Negative for cough, shortness of breath and wheezing.   Cardiovascular:  Negative for chest pain, palpitations and leg swelling.  Gastrointestinal:  Negative for blood in stool, constipation, diarrhea, nausea and vomiting.  Endocrine: Negative for cold intolerance, heat intolerance and polyuria.  Genitourinary:  Negative for dyspareunia, dysuria, flank pain, frequency, genital sores, hematuria, menstrual problem, pelvic pain, urgency, vaginal bleeding, vaginal discharge and vaginal pain.  Musculoskeletal:  Negative for back pain, joint swelling and myalgias.  Skin:  Negative for rash.  Neurological:  Negative for dizziness, syncope, light-headedness, numbness and headaches.  Hematological:  Negative for adenopathy.  Psychiatric/Behavioral:  Negative for agitation, confusion, sleep disturbance and suicidal ideas. The patient is not nervous/anxious.    BREAST: No  symptoms   Objective: BP 121/78   Pulse 83   Ht 5' 4 (1.626 m)   Wt 177 lb (80.3 kg)   LMP 03/26/2023 (Approximate)   BMI 30.38 kg/m    Physical Exam Constitutional:      Appearance: She is well-developed.  Genitourinary:     Vulva normal.     Right Labia: No rash, tenderness or lesions.    Left Labia: No tenderness, lesions or rash.    No vaginal discharge, erythema or tenderness.      Right Adnexa: not tender and no mass present.    Left Adnexa: not tender and no mass present.    No cervical friability or polyp.     Uterus is not enlarged or tender.  Breasts:    Right: No mass, nipple discharge, skin change or tenderness.     Left: No mass, nipple discharge, skin change or tenderness.  Neck:     Thyroid: No thyromegaly.  Cardiovascular:  Rate and Rhythm: Normal rate and regular rhythm.     Heart sounds: Normal heart sounds. No murmur heard. Pulmonary:     Effort: Pulmonary effort is normal.     Breath sounds: Normal breath sounds.  Abdominal:     Palpations: Abdomen is soft.     Tenderness: There is no abdominal tenderness. There is no guarding or rebound.  Musculoskeletal:        General: Normal range of motion.     Cervical back: Normal range of motion.  Lymphadenopathy:     Cervical: No cervical adenopathy.  Neurological:     General: No focal deficit present.     Mental Status: She is alert and oriented to person, place, and time.     Cranial Nerves: No cranial nerve deficit.  Skin:    General: Skin is warm and dry.  Psychiatric:        Mood and Affect: Mood normal.        Behavior: Behavior normal.        Thought Content: Thought content normal.        Judgment: Judgment normal.  Vitals reviewed.     Assessment/Plan: Encounter for annual routine gynecological examination  Encounter for surveillance of contraceptive pills - Plan: norgestimate -ethinyl estradiol  (ORTHO-CYCLEN) 0.25-35 MG-MCG tablet; OCP RF  Family history of breast cancer--pt  is MyRisk neg, no extra screening recommended.   Weight loss - Plan: phentermine  (ADIPEX-P ) 37.5 MG tablet; Rx RF for 3 months. Increase protein. F/u prn.    Meds ordered this encounter  Medications   norgestimate -ethinyl estradiol  (ORTHO-CYCLEN) 0.25-35 MG-MCG tablet    Sig: TAKE 1 TABLET BY MOUTH EVERY DAY    Dispense:  84 tablet    Refill:  3    Supervising Provider:   CHERRY, ANIKA [[id number]]   phentermine  (ADIPEX-P ) 37.5 MG tablet    Sig: Take 1 tablet (37.5 mg total) by mouth daily before breakfast.    Dispense:  30 tablet    Refill:  2    Supervising Provider:   CHERRY, ANIKA [[id number]]              GYN counsel breast self exam, mammography screening, menopause, adequate intake of calcium and vitamin D, diet and exercise     F/U  Return in about 1 year (around 04/19/2024).  Cecelia Graciano B. Beatric Fulop, PA-C 04/20/2023 8:44 AM

## 2023-04-20 ENCOUNTER — Ambulatory Visit (INDEPENDENT_AMBULATORY_CARE_PROVIDER_SITE_OTHER): Payer: No Typology Code available for payment source | Admitting: Obstetrics and Gynecology

## 2023-04-20 ENCOUNTER — Encounter: Payer: Self-pay | Admitting: Obstetrics and Gynecology

## 2023-04-20 VITALS — BP 121/78 | HR 83 | Ht 64.0 in | Wt 177.0 lb

## 2023-04-20 DIAGNOSIS — Z803 Family history of malignant neoplasm of breast: Secondary | ICD-10-CM

## 2023-04-20 DIAGNOSIS — Z01419 Encounter for gynecological examination (general) (routine) without abnormal findings: Secondary | ICD-10-CM | POA: Diagnosis not present

## 2023-04-20 DIAGNOSIS — Z3041 Encounter for surveillance of contraceptive pills: Secondary | ICD-10-CM

## 2023-04-20 DIAGNOSIS — R634 Abnormal weight loss: Secondary | ICD-10-CM

## 2023-04-20 MED ORDER — PHENTERMINE HCL 37.5 MG PO TABS
37.5000 mg | ORAL_TABLET | Freq: Every day | ORAL | 2 refills | Status: AC
Start: 2023-04-20 — End: ?

## 2023-04-20 MED ORDER — NORGESTIMATE-ETH ESTRADIOL 0.25-35 MG-MCG PO TABS: ORAL_TABLET | ORAL | 3 refills | Status: DC

## 2023-04-20 NOTE — Patient Instructions (Signed)
 I value your feedback and you entrusting [place] with your care. If you get a [facility] patient survey, I would appreciate you taking the time to let us know about your experience today. Thank you! ? ? ?

## 2024-01-15 ENCOUNTER — Ambulatory Visit
Admission: EM | Admit: 2024-01-15 | Discharge: 2024-01-15 | Disposition: A | Discharge status: Home / Self Care | Attending: Physician Assistant | Admitting: Physician Assistant

## 2024-01-15 ENCOUNTER — Encounter: Payer: Self-pay | Admitting: Emergency Medicine

## 2024-01-15 DIAGNOSIS — R0602 Shortness of breath: Secondary | ICD-10-CM

## 2024-01-15 DIAGNOSIS — T7840XA Allergy, unspecified, initial encounter: Secondary | ICD-10-CM

## 2024-01-15 DIAGNOSIS — R21 Rash and other nonspecific skin eruption: Secondary | ICD-10-CM | POA: Diagnosis not present

## 2024-01-15 MED ORDER — ALBUTEROL SULFATE (2.5 MG/3ML) 0.083% IN NEBU
2.5000 mg | INHALATION_SOLUTION | Freq: Once | RESPIRATORY_TRACT | Status: AC
  Administered 2024-01-15: 2.5 mg via RESPIRATORY_TRACT

## 2024-01-15 MED ORDER — DEXAMETHASONE SODIUM PHOSPHATE 10 MG/ML IJ SOLN
10.0000 mg | Freq: Once | INTRAMUSCULAR | Status: AC
  Administered 2024-01-15: 10 mg via INTRAMUSCULAR

## 2024-01-15 MED ORDER — EPINEPHRINE 0.3 MG/0.3ML IJ SOAJ: 0.3000 mg | INTRAMUSCULAR | 0 refills | Status: AC | PRN

## 2024-01-15 MED ORDER — PREDNISONE 10 MG PO TABS: ORAL_TABLET | ORAL | 0 refills | Status: DC

## 2024-01-15 NOTE — Discharge Instructions (Addendum)
-   You have given an injection of dexamethasone corticosteroid in the clinic.  Start the prednisone  tomorrow. - We gave you a breathing treatment. Use albuterol  at home as needed - Continue Benadryl 25 to 50 mg every 6 hours as needed for rash.  Also consider starting famotidine  20 to 40 mg daily until rash resolves. - Cool compresses. - I put a referral into allergist. - If you have swelling of face, throat or increased shortness of breath use the EpiPen  and call 911.

## 2024-01-15 NOTE — ED Provider Notes (Signed)
 [facility]    CSN: [id number] Arrival date & time: 01/15/24  1757      History   Chief Complaint Chief Complaint  Patient presents with   Shortness of Breath   Urticaria    HPI Kelsey Bryant is a 38 y.o. female presenting for allergic reaction.  Reports rash of face, neck and chest. Patient says she has had symptoms intermittently over the past 3 days but symptoms worsened today.  She reports chest pressure and shortness of breath today. Has taken Benadryl 50 mg and says symptoms are improving a little bit. She denies numbness and tingling of her face and lips, sensation of throat fullness, tongue swelling, and facial swelling. She is not having any difficulty swallowing.   Patient reports similar reactions to cephalosporins, doxycycline , and penicillins.  Denies any new medications or foods but has been around more dogs recently and thinks she may have an allergy to dogs. No other complaints.  HPI  Past Medical History:  Diagnosis Date   Anxiety    BRCA negative 01/2021   MyRisk neg except AXIN2 and MSH2 VUS   Diverticulosis    Family history of breast cancer 01/2021   IBIS=12.8%.riskscore=9.8%   Insomnia    Ovarian cyst     Patient Active Problem List   Diagnosis Date Noted   Family history of breast cancer 01/04/2021    Past Surgical History:  Procedure Laterality Date   COLONOSCOPY WITH PROPOFOL  N/A 12/24/2021   Procedure: COLONOSCOPY WITH PROPOFOL ;  Surgeon: Maryruth Ole DASEN, MD;  Location: [facility];  Service: Endoscopy;  Laterality: N/A;   ESOPHAGOGASTRODUODENOSCOPY (EGD) WITH PROPOFOL  N/A 12/24/2021   Procedure: ESOPHAGOGASTRODUODENOSCOPY (EGD) WITH PROPOFOL ;  Surgeon: Maryruth Ole DASEN, MD;  Location: [facility];  Service: Endoscopy;  Laterality: N/A;   INTRAUTERINE DEVICE (IUD) INSERTION  2012    OB History     Gravida  2   Para  2   Term  2   Preterm      AB      Living  2      SAB      IAB      Ectopic       Multiple      Live Births  2            Home Medications    Prior to Admission medications   Medication Sig Start Date End Date Taking? Authorizing Provider  EPINEPHrine  0.3 mg/0.3 mL IJ SOAJ injection Inject 0.3 mg into the muscle as needed for anaphylaxis. 01/15/24  Yes Arvis Huxley B, PA-C  predniSONE  (DELTASONE ) 10 MG tablet Take 5 tabs po on day 1 and decrease by 1 tab daily until complete 01/15/24  Yes Arvis Huxley B, PA-C  albuterol  (VENTOLIN  HFA) 108 (90 Base) MCG/ACT inhaler Inhale 2 puffs into the lungs every 6 (six) hours as needed for wheezing or shortness of breath. 11/16/20   Joshua Cathryne BROCKS, MD  ALPRAZolam  (XANAX ) 0.25 MG tablet Take 1 tablet (0.25 mg total) by mouth 2 (two) times daily as needed for anxiety. 11/16/20   Joshua Cathryne BROCKS, MD  norgestimate -ethinyl estradiol  (ORTHO-CYCLEN) 0.25-35 MG-MCG tablet TAKE 1 TABLET BY MOUTH EVERY DAY 04/20/23   Copland, Alicia B, PA-C  phentermine  (ADIPEX-P ) 37.5 MG tablet Take 1 tablet (37.5 mg total) by mouth daily before breakfast. 04/20/23   Copland, Alicia B, PA-C  sertraline  (ZOLOFT ) 100 MG tablet TAKE 1 TABLET BY MOUTH EVERY DAY 07/19/22   Jones, Deanna C, MD  traZODone  (DESYREL ) 50 MG tablet TAKE 0.5-1 TABLETS (25-50 MG TOTAL) BY MOUTH AT BEDTIME AS NEEDED. FOR SLEEP 05/20/22   Joshua Cathryne BROCKS, MD  valACYclovir  (VALTREX ) 1000 MG tablet Take 0.5 tablets (500 mg total) by mouth 2 (two) times daily. 11/23/21   Joshua Cathryne BROCKS, MD    Family History Family History  Problem Relation Age of Onset   Colon cancer Maternal Aunt 60   Pancreatic cancer Maternal Uncle 60   Breast cancer Maternal Grandmother 59   Bladder Cancer Paternal Grandfather     Social History Social History   Tobacco Use   Smoking status: Former   Smokeless tobacco: Never  Advertising account planner   Vaping status: Never Used  Substance Use Topics   Alcohol use: Yes    Comment: occ   Drug use: No     Allergies   Ceftin [cefuroxime], Doxycycline , Penicillins, Pantoprazole ,  and Cefuroxime axetil   Review of Systems Review of Systems  Constitutional:  Negative for fatigue.  HENT:  Negative for facial swelling, sore throat and trouble swallowing.   Respiratory:  Positive for chest tightness and shortness of breath. Negative for wheezing.   Cardiovascular:  Negative for chest pain.  Gastrointestinal:  Negative for nausea and vomiting.  Musculoskeletal:  Negative for myalgias.  Skin:  Positive for rash.  Neurological:  Negative for syncope and weakness.     Physical Exam Triage Vital Signs  No data found.  Updated Vital Signs BP (!) 129/93 (BP Location: Right Arm)   Pulse 86   Temp 98.4 F (36.9 C) (Oral)   Resp 18   LMP 01/01/2024   SpO2 98%   Physical Exam Vitals and nursing note reviewed.  Constitutional:      General: She is not in acute distress.    Appearance: Normal appearance. She is not ill-appearing or toxic-appearing.  HENT:     Head: Normocephalic and atraumatic.     Comments: No facial swelling    Nose: Nose normal.     Mouth/Throat:     Mouth: Mucous membranes are moist.     Pharynx: Oropharynx is clear.     Comments: No swelling of posterior pharynx and uvula, or tongue. Does not appear to have any lip swelling Eyes:     General: No scleral icterus.       Right eye: No discharge.        Left eye: No discharge.     Conjunctiva/sclera: Conjunctivae normal.  Cardiovascular:     Rate and Rhythm: Normal rate and regular rhythm.     Heart sounds: Normal heart sounds.  Pulmonary:     Effort: Pulmonary effort is normal. No respiratory distress.     Breath sounds: Normal breath sounds. No wheezing, rhonchi or rales.  Musculoskeletal:     Cervical back: Neck supple.  Skin:    General: Skin is dry.     Findings: Rash (erythematous macular rash of chest, cheeks, and neck) present.  Neurological:     General: No focal deficit present.     Mental Status: She is alert. Mental status is at baseline.     Motor: No weakness.      Gait: Gait normal.  Psychiatric:        Mood and Affect: Mood normal.        Behavior: Behavior normal.      UC Treatments / Results  Labs (all labs ordered are listed, but only abnormal results are displayed) Labs Reviewed - No data to display  EKG   Radiology No results found.  Procedures Procedures (including critical care time)  Medications Ordered in UC Medications  dexamethasone (DECADRON) injection 10 mg (10 mg Intramuscular Given 01/15/24 1818)  albuterol  (PROVENTIL ) (2.5 MG/3ML) 0.083% nebulizer solution 2.5 mg (2.5 mg Nebulization Given 01/15/24 1819)    Initial Impression / Assessment and Plan / UC Course  I have reviewed the triage vital signs and the nursing notes.  Pertinent labs & imaging results that were available during my care of the patient were reviewed by me and considered in my medical decision making (see chart for details).  38 year old female presenting for allergic reaction over the past 3 days with worsening of symptoms today.  Patient has been experiencing a rash of her chest, neck and cheeks. Reports feeling chest pressure and SOB today. Took Benadryl 30-60 minutes ago. Symptoms are improving. Patient has not had any new medications or foods. History of similar reactions to antibiotics.   Vitals all normal stable.  Patient is not in any distress.  She does not have any swelling of her cheeks/face or intraoral swelling. Chest clear to auscultation heart regular rate and rhythm.  Erythematous macular rash of chest, cheeks and neck.  Patient given 10 mg IM dexamethasone in clinic. Patient also given albuterol  neb.   Patient's symptoms continue to improve. Rash is fading and she is not in any acute distress.  Advised close monitoring.  Watch patient over the course of 2 hours.  Discussed allergic reactions with patient.  Sent EpiPen  and discussed how to use it.  Advised starting prednisone  taper tomorrow.  Advised continuing antihistamines every 4-6  hours over the next couple of days.  Thoroughly reviewed ED precautions with patient. Allergy referral placed.  Final Clinical Impressions(s) / UC Diagnoses   Final diagnoses:  Allergic reaction, initial encounter  Rash and nonspecific skin eruption  Shortness of breath     Discharge Instructions      - You have given an injection of dexamethasone corticosteroid in the clinic.  Start the prednisone  tomorrow. - We gave you a breathing treatment and I sent an inhaler to the pharmacy as needed. - Continue Benadryl 25 to 50 mg every 6 hours as needed for rash.  Also consider starting famotidine  20 to 40 mg daily until rash resolves. - Cool compresses. - I put a referral into allergist. - If you have swelling of face, throat or increased shortness of breath use the EpiPen  and call 911.      ED Prescriptions     Medication Sig Dispense Auth. Provider   EPINEPHrine  0.3 mg/0.3 mL IJ SOAJ injection Inject 0.3 mg into the muscle as needed for anaphylaxis. 1 each Arvis Jolan NOVAK, PA-C   predniSONE  (DELTASONE ) 10 MG tablet Take 5 tabs po on day 1 and decrease by 1 tab daily until complete 15 tablet Arvis Jolan NOVAK, PA-C      PDMP not reviewed this encounter.     Arvis Jolan NOVAK, PA-C 01/15/24 1840

## 2024-01-15 NOTE — ED Triage Notes (Signed)
 Pt presents with Hives and SOB off and on x 2 days. She is unsure what she has been in contact with. She has taken benadryl for her symptoms.

## 2024-02-09 ENCOUNTER — Ambulatory Visit: Admitting: Internal Medicine

## 2024-03-27 ENCOUNTER — Other Ambulatory Visit: Payer: Self-pay | Admitting: Obstetrics and Gynecology

## 2024-03-27 DIAGNOSIS — Z3041 Encounter for surveillance of contraceptive pills: Secondary | ICD-10-CM

## 2024-03-29 ENCOUNTER — Other Ambulatory Visit: Payer: Self-pay

## 2024-03-29 DIAGNOSIS — Z3041 Encounter for surveillance of contraceptive pills: Secondary | ICD-10-CM

## 2024-03-29 MED ORDER — NORGESTIMATE-ETH ESTRADIOL 0.25-35 MG-MCG PO TABS: ORAL_TABLET | ORAL | 3 refills | Status: DC

## 2024-04-02 NOTE — Progress Notes (Signed)
 Done. Was advised patient had picked up 84 tablets already.

## 2024-04-23 NOTE — Progress Notes (Unsigned)
 "  PCP:  Joshua Cathryne BROCKS, MD (Inactive)   No chief complaint on file.    HPI:      Kelsey Bryant is a 39 y.o. [phone] whose LMP was No LMP recorded. (Menstrual status: Oral contraceptives)., presents today for her annual examination.  Her menses are regular every 28-30 days, lasting 5 days on OCPs.  Dysmenorrhea mild, occurring first 1-2 days of flow. No BTB.   Sex activity: single partner, contraception - OCP (estrogen/progesterone). No pain/bleeding/dryness. Hx of dyspareunia in past but was GI related. Last Pap: 03/22/22 Results were: no abnormalities /neg HPV DNA  Hx of STDs: none  Treated for yeast vag 9/24 with diflucan  x 2 with sx relief.   There is a FH of breast cancer in her MGM, pancreatic cancer in her mat uncle and colon cancer in her mat aunt. There is no FH of ovarian cancer. The patient does self-breast exams. Pt is MyRisk neg except AXIN2 1 and MSH2 VUS 10/22. IBIS=12.8%/riskscore=9.8%   Colonoscopy: 9/23 with [facility]; hx of diverticulosis/pelvic pain.  Tobacco use: none;  quit cigs Alcohol use: occas No drug use.  Exercise: moderately active  She does not get adequate calcium and Vitamin D in her diet. Labs with PCP. Would like RF phentermine  RF. Has done a couple times with wt loss. Gained wt at Christmas. 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  Past Medical History:  Diagnosis Date   Anxiety    BRCA negative 01/2021   MyRisk neg except AXIN2 and MSH2 VUS   Diverticulosis    Family history of breast cancer 01/2021   IBIS=12.8%.riskscore=9.8%   Insomnia    Ovarian cyst     Past Surgical History:  Procedure Laterality Date   COLONOSCOPY WITH PROPOFOL  N/A 12/24/2021   Procedure: COLONOSCOPY WITH PROPOFOL ;  Surgeon: Maryruth Ole DASEN, MD;  Location: [facility];  Service: Endoscopy;  Laterality: N/A;   ESOPHAGOGASTRODUODENOSCOPY (EGD) WITH PROPOFOL  N/A 12/24/2021   Procedure: ESOPHAGOGASTRODUODENOSCOPY (EGD) WITH PROPOFOL ;  Surgeon:  Maryruth Ole DASEN, MD;  Location: [facility];  Service: Endoscopy;  Laterality: N/A;   INTRAUTERINE DEVICE (IUD) INSERTION  2012    Family History  Problem Relation Age of Onset   Colon cancer Maternal Aunt 60   Pancreatic cancer Maternal Uncle 60   Breast cancer Maternal Grandmother 42   Bladder Cancer Paternal Grandfather     Social History   Socioeconomic History   Marital status: Single    Spouse name: Not on file   Number of children: Not on file   Years of education: Not on file   Highest education level: Not on file  Occupational History   Not on file  Tobacco Use   Smoking status: Former   Smokeless tobacco: Never  Vaping Use   Vaping status: Never Used  Substance and Sexual Activity   Alcohol use: Yes    Comment: occ   Drug use: No   Sexual activity: Yes    Birth control/protection: Pill  Other Topics Concern   Not on file  Social History Narrative   Not on file   Social Drivers of Health   Tobacco Use: Medium Risk (01/15/2024)   Patient History    Smoking Tobacco Use: Former    Smokeless Tobacco Use: Never    Passive Exposure: Not on file  Financial Resource Strain: Low Risk  (12/01/2022)   [facility]   Overall Financial Resource Strain (CARDIA)    Difficulty of  Paying Living Expenses: Not very hard  Food Insecurity: No Food Insecurity (12/01/2022)   Received from [facility]   Epic    Within the past 12 months, you worried that your food would run out before you got the money to buy more.: Never true    Within the past 12 months, the food you bought just didn't last and you didn't have money to get more.: Never true  Transportation Needs: No Transportation Needs (12/01/2022)   Received from [facility] - Transportation    In the past 12 months, has lack of transportation kept you from medical appointments or from getting medications?: No    Lack of Transportation  (Non-Medical): No  Physical Activity: Not on file  Stress: Not on file  Social Connections: Not on file  Intimate Partner Violence: Not on file  Depression (PHQ2-9): Low Risk (11/23/2021)   Depression (PHQ2-9)    PHQ-2 Score: 4  Alcohol Screen: Not on file  Housing: Unknown (05/30/2023)   Received from [facility]   Epic    At any time in the past 12 months, were you homeless or living in a shelter (including now)?: No    Number of Times Moved in the Last Year: Not on file    In the last 12 months, was there a time when you were not able to pay the mortgage or rent on time?: No  Utilities: Patient Declined (12/01/2022)   Received from [facility]   [facility] Utilities    Threatened with loss of utilities: Patient declined  Health Literacy: Not on file     Current Outpatient Medications:    albuterol  (VENTOLIN  HFA) 108 (90 Base) MCG/ACT inhaler, Inhale 2 puffs into the lungs every 6 (six) hours as needed for wheezing or shortness of breath., Disp: 8 g, Rfl: 2   ALPRAZolam  (XANAX ) 0.25 MG tablet, Take 1 tablet (0.25 mg total) by mouth 2 (two) times daily as needed for anxiety., Disp: 30 tablet, Rfl: 0   EPINEPHrine  0.3 mg/0.3 mL IJ SOAJ injection, Inject 0.3 mg into the muscle as needed for anaphylaxis., Disp: 1 each, Rfl: 0   norgestimate -ethinyl estradiol  (ORTHO-CYCLEN) 0.25-35 MG-MCG tablet, TAKE 1 TABLET BY MOUTH EVERY DAY, Disp: 84 tablet, Rfl: 3   phentermine  (ADIPEX-P ) 37.5 MG tablet, Take 1 tablet (37.5 mg total) by mouth daily before breakfast., Disp: 30 tablet, Rfl: 2   predniSONE  (DELTASONE ) 10 MG tablet, Take 5 tabs po on day 1 and decrease by 1 tab daily until complete, Disp: 15 tablet, Rfl: 0   sertraline  (ZOLOFT ) 100 MG tablet, TAKE 1 TABLET BY MOUTH EVERY DAY, Disp: 15 tablet, Rfl: 0   traZODone  (DESYREL ) 50 MG tablet, TAKE 0.5-1 TABLETS (25-50 MG TOTAL) BY MOUTH AT BEDTIME AS NEEDED. FOR SLEEP, Disp: 90 tablet, Rfl: 0   valACYclovir  (VALTREX )  1000 MG tablet, Take 0.5 tablets (500 mg total) by mouth 2 (two) times daily., Disp: 8 tablet, Rfl: 3   ROS:  Review of Systems  Constitutional:  Negative for fatigue, fever and unexpected weight change.  Respiratory:  Negative for cough, shortness of breath and wheezing.   Cardiovascular:  Negative for chest pain, palpitations and leg swelling.  Gastrointestinal:  Negative for blood in stool, constipation, diarrhea, nausea and vomiting.  Endocrine: Negative for cold intolerance, heat intolerance and polyuria.  Genitourinary:  Negative for dyspareunia, dysuria, flank pain, frequency, genital sores, hematuria, menstrual problem, pelvic pain, urgency, vaginal bleeding, vaginal  discharge and vaginal pain.  Musculoskeletal:  Negative for back pain, joint swelling and myalgias.  Skin:  Negative for rash.  Neurological:  Negative for dizziness, syncope, light-headedness, numbness and headaches.  Hematological:  Negative for adenopathy.  Psychiatric/Behavioral:  Negative for agitation, confusion, sleep disturbance and suicidal ideas. The patient is not nervous/anxious.    BREAST: No symptoms   Objective: There were no vitals taken for this visit.   Physical Exam Constitutional:      Appearance: She is well-developed.  Genitourinary:     Vulva normal.     Right Labia: No rash, tenderness or lesions.    Left Labia: No tenderness, lesions or rash.    No vaginal discharge, erythema or tenderness.      Right Adnexa: not tender and no mass present.    Left Adnexa: not tender and no mass present.    No cervical friability or polyp.     Uterus is not enlarged or tender.  Breasts:    Right: No mass, nipple discharge, skin change or tenderness.     Left: No mass, nipple discharge, skin change or tenderness.  Neck:     Thyroid: No thyromegaly.  Cardiovascular:     Rate and Rhythm: Normal rate and regular rhythm.     Heart sounds: Normal heart sounds. No murmur heard. Pulmonary:      Effort: Pulmonary effort is normal.     Breath sounds: Normal breath sounds.  Abdominal:     Palpations: Abdomen is soft.     Tenderness: There is no abdominal tenderness. There is no guarding or rebound.  Musculoskeletal:        General: Normal range of motion.     Cervical back: Normal range of motion.  Lymphadenopathy:     Cervical: No cervical adenopathy.  Neurological:     General: No focal deficit present.     Mental Status: She is alert and oriented to person, place, and time.     Cranial Nerves: No cranial nerve deficit.  Skin:    General: Skin is warm and dry.  Psychiatric:        Mood and Affect: Mood normal.        Behavior: Behavior normal.        Thought Content: Thought content normal.        Judgment: Judgment normal.  Vitals reviewed.     Assessment/Plan: Encounter for annual routine gynecological examination  Encounter for surveillance of contraceptive pills - Plan: norgestimate -ethinyl estradiol  (ORTHO-CYCLEN) 0.25-35 MG-MCG tablet; OCP RF  Family history of breast cancer--pt is MyRisk neg, no extra screening recommended.   Weight loss - Plan: phentermine  (ADIPEX-P ) 37.5 MG tablet; Rx RF for 3 months. Increase protein. F/u prn.    No orders of the defined types were placed in this encounter.             GYN counsel breast self exam, mammography screening, menopause, adequate intake of calcium and vitamin D, diet and exercise     F/U  No follow-ups on file.  Halei Hanover B. Zennie Ayars, PA-C 04/23/2024 11:10 AM  "

## 2024-04-25 ENCOUNTER — Encounter: Payer: Self-pay | Admitting: Obstetrics and Gynecology

## 2024-04-25 ENCOUNTER — Ambulatory Visit (INDEPENDENT_AMBULATORY_CARE_PROVIDER_SITE_OTHER): Admitting: Obstetrics and Gynecology

## 2024-04-25 VITALS — BP 113/75 | HR 103 | Ht 63.0 in | Wt 167.0 lb

## 2024-04-25 DIAGNOSIS — Z3041 Encounter for surveillance of contraceptive pills: Secondary | ICD-10-CM

## 2024-04-25 DIAGNOSIS — Z803 Family history of malignant neoplasm of breast: Secondary | ICD-10-CM

## 2024-04-25 DIAGNOSIS — N898 Other specified noninflammatory disorders of vagina: Secondary | ICD-10-CM

## 2024-04-25 DIAGNOSIS — Z01411 Encounter for gynecological examination (general) (routine) with abnormal findings: Secondary | ICD-10-CM

## 2024-04-25 DIAGNOSIS — Z01419 Encounter for gynecological examination (general) (routine) without abnormal findings: Secondary | ICD-10-CM

## 2024-04-25 MED ORDER — CLOTRIMAZOLE-BETAMETHASONE 1-0.05 % EX CREA: TOPICAL_CREAM | CUTANEOUS | 0 refills | Status: AC

## 2024-04-25 MED ORDER — NORGESTIMATE-ETH ESTRADIOL 0.25-35 MG-MCG PO TABS: ORAL_TABLET | ORAL | 3 refills | Status: DC

## 2024-04-25 MED ORDER — NORGESTIMATE-ETH ESTRADIOL 0.25-35 MG-MCG PO TABS: ORAL_TABLET | ORAL | 3 refills | Status: AC

## 2024-04-25 NOTE — Patient Instructions (Signed)
 I value your feedback and you entrusting [place] with your care. If you get a [facility] patient survey, I would appreciate you taking the time to let us know about your experience today. Thank you! ? ? ?
